# Patient Record
Sex: Male | Born: 1960 | Race: White | Hispanic: No | Marital: Married | Smoking: Never smoker
Health system: Southern US, Community
[De-identification: ages and names within clinical notes are randomized; demographics above are authoritative.]

## PROBLEM LIST (undated history)

## (undated) DIAGNOSIS — I10 Essential (primary) hypertension: Secondary | ICD-10-CM

## (undated) DIAGNOSIS — J342 Deviated nasal septum: Secondary | ICD-10-CM

## (undated) DIAGNOSIS — E785 Hyperlipidemia, unspecified: Secondary | ICD-10-CM

## (undated) DIAGNOSIS — M810 Age-related osteoporosis without current pathological fracture: Secondary | ICD-10-CM

## (undated) DIAGNOSIS — Z972 Presence of dental prosthetic device (complete) (partial): Secondary | ICD-10-CM

## (undated) DIAGNOSIS — J310 Chronic rhinitis: Secondary | ICD-10-CM

## (undated) HISTORY — PX: HERNIA REPAIR: SHX51

## (undated) HISTORY — PX: CLEFT PALATE REPAIR: SUR1165

## (undated) HISTORY — PX: COLONOSCOPY: SHX174

## (undated) HISTORY — PX: BACK SURGERY: SHX140

## (undated) HISTORY — PX: NASAL SINUS SURGERY: SHX719

## (undated) HISTORY — PX: SPINAL FUSION: SHX223

---

## 2010-03-08 ENCOUNTER — Ambulatory Visit: Payer: Self-pay | Admitting: Gastroenterology

## 2016-03-30 ENCOUNTER — Ambulatory Visit
Admission: RE | Admit: 2016-03-30 | Discharge: 2016-03-30 | Disposition: A | Discharge status: Home / Self Care | Payer: BLUE CROSS/BLUE SHIELD | Source: Ambulatory Visit | Attending: Otolaryngology | Admitting: Otolaryngology

## 2016-03-30 ENCOUNTER — Encounter
Admission: RE | Disposition: A | Discharge status: Home / Self Care | Payer: Self-pay | Source: Ambulatory Visit | Attending: Otolaryngology

## 2016-03-30 ENCOUNTER — Ambulatory Visit: Payer: BLUE CROSS/BLUE SHIELD | Admitting: Certified Registered"

## 2016-03-30 DIAGNOSIS — Z79899 Other long term (current) drug therapy: Secondary | ICD-10-CM | POA: Diagnosis not present

## 2016-03-30 DIAGNOSIS — J320 Chronic maxillary sinusitis: Secondary | ICD-10-CM | POA: Insufficient documentation

## 2016-03-30 DIAGNOSIS — J322 Chronic ethmoidal sinusitis: Secondary | ICD-10-CM | POA: Diagnosis not present

## 2016-03-30 DIAGNOSIS — Z880 Allergy status to penicillin: Secondary | ICD-10-CM | POA: Diagnosis not present

## 2016-03-30 DIAGNOSIS — J321 Chronic frontal sinusitis: Secondary | ICD-10-CM | POA: Insufficient documentation

## 2016-03-30 DIAGNOSIS — Z882 Allergy status to sulfonamides status: Secondary | ICD-10-CM | POA: Diagnosis not present

## 2016-03-30 DIAGNOSIS — I10 Essential (primary) hypertension: Secondary | ICD-10-CM | POA: Insufficient documentation

## 2016-03-30 DIAGNOSIS — Z981 Arthrodesis status: Secondary | ICD-10-CM | POA: Insufficient documentation

## 2016-03-30 HISTORY — DX: Deviated nasal septum: J34.2

## 2016-03-30 HISTORY — DX: Age-related osteoporosis without current pathological fracture: M81.0

## 2016-03-30 HISTORY — DX: Essential (primary) hypertension: I10

## 2016-03-30 HISTORY — DX: Presence of dental prosthetic device (complete) (partial): Z97.2

## 2016-03-30 HISTORY — PX: IMAGE GUIDED SINUS SURGERY: SHX6570

## 2016-03-30 HISTORY — DX: Chronic rhinitis: J31.0

## 2016-03-30 SURGERY — SINUS SURGERY, WITH IMAGING GUIDANCE
Anesthesia: General | Site: Nose | Wound class: Dirty or Infected

## 2016-03-30 MED ORDER — GLYCOPYRROLATE 0.2 MG/ML IJ SOLN
INTRAMUSCULAR | Status: DC | PRN
  Administered 2016-03-30: 0.1 mg via INTRAVENOUS

## 2016-03-30 MED ORDER — FENTANYL CITRATE (PF) 100 MCG/2ML IJ SOLN
INTRAMUSCULAR | Status: DC | PRN
  Administered 2016-03-30: 100 ug via INTRAVENOUS

## 2016-03-30 MED ORDER — LACTATED RINGERS IV SOLN
INTRAVENOUS | Status: DC
  Administered 2016-03-30: 07:00:00 via INTRAVENOUS

## 2016-03-30 MED ORDER — MIDAZOLAM HCL 5 MG/5ML IJ SOLN
INTRAMUSCULAR | Status: DC | PRN
  Administered 2016-03-30: 2 mg via INTRAVENOUS

## 2016-03-30 MED ORDER — LIDOCAINE-EPINEPHRINE 1 %-1:100000 IJ SOLN
INTRAMUSCULAR | Status: DC | PRN
  Administered 2016-03-30: 4 mL

## 2016-03-30 MED ORDER — ROCURONIUM BROMIDE 100 MG/10ML IV SOLN
INTRAVENOUS | Status: DC | PRN
  Administered 2016-03-30: 30 mg via INTRAVENOUS

## 2016-03-30 MED ORDER — SCOPOLAMINE 1 MG/3DAYS TD PT72
1.0000 | MEDICATED_PATCH | TRANSDERMAL | Status: DC
  Administered 2016-03-30: 1.5 mg via TRANSDERMAL

## 2016-03-30 MED ORDER — HYDROMORPHONE HCL 1 MG/ML IJ SOLN: 0.2500 mg | INTRAMUSCULAR | Status: DC | PRN

## 2016-03-30 MED ORDER — PROPOFOL 10 MG/ML IV BOLUS
INTRAVENOUS | Status: DC | PRN
  Administered 2016-03-30: 100 mg via INTRAVENOUS

## 2016-03-30 MED ORDER — OXYMETAZOLINE HCL 0.05 % NA SOLN
2.0000 | Freq: Once | NASAL | Status: AC
  Administered 2016-03-30: 2 via NASAL

## 2016-03-30 MED ORDER — LEVOFLOXACIN IN D5W 500 MG/100ML IV SOLN
500.0000 mg | Freq: Once | INTRAVENOUS | Status: AC
  Administered 2016-03-30: 500 mg via INTRAVENOUS

## 2016-03-30 MED ORDER — CEFAZOLIN SODIUM-DEXTROSE 2-4 GM/100ML-% IV SOLN: 2.0000 g | Freq: Once | INTRAVENOUS | Status: DC

## 2016-03-30 MED ORDER — OXYCODONE HCL 5 MG PO TABS
5.0000 mg | ORAL_TABLET | Freq: Once | ORAL | Status: AC | PRN
  Administered 2016-03-30: 5 mg via ORAL

## 2016-03-30 MED ORDER — MEPERIDINE HCL 25 MG/ML IJ SOLN: 6.2500 mg | INTRAMUSCULAR | Status: DC | PRN

## 2016-03-30 MED ORDER — DEXAMETHASONE SODIUM PHOSPHATE 4 MG/ML IJ SOLN
INTRAMUSCULAR | Status: DC | PRN
  Administered 2016-03-30: 10 mg via INTRAVENOUS

## 2016-03-30 MED ORDER — LIDOCAINE HCL (CARDIAC) 20 MG/ML IV SOLN
INTRAVENOUS | Status: DC | PRN
  Administered 2016-03-30: 40 mg via INTRAVENOUS

## 2016-03-30 MED ORDER — PROMETHAZINE HCL 25 MG/ML IJ SOLN: 6.2500 mg | INTRAMUSCULAR | Status: DC | PRN

## 2016-03-30 MED ORDER — ONDANSETRON HCL 4 MG/2ML IJ SOLN
INTRAMUSCULAR | Status: DC | PRN
  Administered 2016-03-30: 4 mg via INTRAVENOUS

## 2016-03-30 MED ORDER — ACETAMINOPHEN 10 MG/ML IV SOLN
1000.0000 mg | Freq: Once | INTRAVENOUS | Status: AC
  Administered 2016-03-30: 1000 mg via INTRAVENOUS

## 2016-03-30 MED ORDER — OXYCODONE HCL 5 MG/5ML PO SOLN: 5.0000 mg | Freq: Once | ORAL | Status: AC | PRN

## 2016-03-30 MED ORDER — PHENYLEPHRINE HCL 0.5 % NA SOLN
NASAL | Status: DC | PRN
  Administered 2016-03-30: 30 mL via TOPICAL

## 2016-03-30 SURGICAL SUPPLY — 29 items
BALLOON SINUPLASTY SYSTEM (BALLOONS) IMPLANT
BATTERY INSTRU NAVIGATION (MISCELLANEOUS) ×16 IMPLANT
CANISTER SUCT 1200ML W/VALVE (MISCELLANEOUS) ×4 IMPLANT
CATH IV 18X1 1/4 SAFELET (CATHETERS) ×4 IMPLANT
COAGULATOR SUCT 8FR VV (MISCELLANEOUS) IMPLANT
DEVICE INFLATION SEID (MISCELLANEOUS) IMPLANT
DRAPE HEAD BAR (DRAPES) ×4 IMPLANT
GLOVE PI ULTRA LF STRL 7.5 (GLOVE) ×4 IMPLANT
GLOVE PI ULTRA NON LATEX 7.5 (GLOVE) ×4
IRRIGATOR 4MM STR (IRRIGATION / IRRIGATOR) IMPLANT
IV CATH 18X1 1/4 SAFELET (CATHETERS) ×2
IV NS 500ML (IV SOLUTION) ×2
IV NS 500ML BAXH (IV SOLUTION) ×2 IMPLANT
KIT ROOM TURNOVER OR (KITS) ×4 IMPLANT
NAVIGATION MASK REG  ST (MISCELLANEOUS) ×4 IMPLANT
NEEDLE HYPO 25GX1X1/2 BEV (NEEDLE) ×4 IMPLANT
NEEDLE SPNL 25GX3.5 QUINCKE BL (NEEDLE) ×4 IMPLANT
NS IRRIG 500ML POUR BTL (IV SOLUTION) ×4 IMPLANT
PACK DRAPE NASAL/ENT (PACKS) ×4 IMPLANT
PACKING NASAL EPIS 4X2.4 XEROG (MISCELLANEOUS) ×8 IMPLANT
PAD GROUND ADULT SPLIT (MISCELLANEOUS) ×4 IMPLANT
PATTIES SURGICAL .5 X3 (DISPOSABLE) ×4 IMPLANT
SET HANDPIECE IRR DIEGO (MISCELLANEOUS) IMPLANT
SOL ANTI-FOG 6CC FOG-OUT (MISCELLANEOUS) ×2 IMPLANT
SOL FOG-OUT ANTI-FOG 6CC (MISCELLANEOUS) ×2
STRAP BODY AND KNEE 60X3 (MISCELLANEOUS) ×4 IMPLANT
SYR 3ML LL SCALE MARK (SYRINGE) ×4 IMPLANT
TOWEL OR 17X26 4PK STRL BLUE (TOWEL DISPOSABLE) ×4 IMPLANT
WATER STERILE IRR 500ML POUR (IV SOLUTION) ×4 IMPLANT

## 2016-03-30 NOTE — Anesthesia Postprocedure Evaluation (Signed)
Anesthesia Post Note  Patient: Jack Morrison  Procedure(s) Performed: Procedure(s) (LRB): IMAGE GUIDED SINUS SURGERY (N/A) MAXILLARY ANTROSTOMY WITH TISSUE REMOVAL (Bilateral) FRONTAL SINUS EXPLORATION (Bilateral) TOTAL ETHMOIDECTOMY (Bilateral)  Patient location during evaluation: PACU Anesthesia Type: General Level of consciousness: awake and alert and oriented Pain management: pain level controlled Vital Signs Assessment: post-procedure vital signs reviewed and stable Respiratory status: spontaneous breathing and nonlabored ventilation Cardiovascular status: stable Postop Assessment: no signs of nausea or vomiting and adequate PO intake Anesthetic complications: no    Harolyn RutherfordJoshua Aryelle Figg

## 2016-03-30 NOTE — Discharge Instructions (Signed)
[facility] °[facility] °ENDOSCOPIC SINUS SURGERY °[facility] ° °What is Functional Endoscopic Sinus Surgery? ° The Surgery involves making the natural openings of the sinuses larger by removing the bony partitions that separate the sinuses from the nasal cavity.  The natural sinus lining is preserved as much as possible to allow the sinuses to resume normal function after the surgery.  In some patients nasal polyps (excessively swollen lining of the sinuses) may be removed to relieve obstruction of the sinus openings.  The surgery is performed through the nose using lighted scopes, which eliminates the need for incisions on the face.  A septoplasty is a different procedure which is sometimes performed with sinus surgery.  It involves straightening the boy partition that separates the two sides of your nose.  A crooked or deviated septum may need repair if is obstructing the sinuses or nasal airflow.  Turbinate reduction is also often performed during sinus surgery.  The turbinates are bony proturberances from the side walls of the nose which swell and can obstruct the nose in patients with sinus and allergy problems.  Their size can be surgically reduced to help relieve nasal obstruction. ° °What Can Sinus Surgery Do For Me? ° Sinus surgery can reduce the frequency of sinus infections requiring antibiotic treatment.  This can provide improvement in nasal congestion, post-nasal drainage, facial pressure and nasal obstruction.  Surgery will NOT prevent you from ever having an infection again, so it usually only for patients who get infections 4 or more times yearly requiring antibiotics, or for infections that do not clear with antibiotics.  It will not cure nasal allergies, so patients with allergies may still require medication to treat their allergies after surgery. Surgery may improve headaches related to sinusitis, however, some people will continue to  require medication to control sinus headaches related to allergies.  Surgery will do nothing for other forms of headache (migraine, tension or cluster). ° °What Are the Risks of Endoscopic Sinus Surgery? ° Current techniques allow surgery to be performed safely with little risk, however, there are rare complications that patients should be aware of.  Because the sinuses are located around the eyes, there is risk of eye injury, including blindness, though again, this would be quite rare. This is usually a result of bleeding behind the eye during surgery, which puts the vision oat risk, though there are treatments to protect the vision and prevent permanent disrupted by surgery causing a leak of the spinal fluid that surrounds the brain.  More serious complications would include bleeding inside the brain cavity or damage to the brain.  Again, all of these complications are uncommon, and spinal fluid leaks can be safely managed surgically if they occur.  The most common complication of sinus surgery is bleeding from the nose, which may require packing or cauterization of the nose.  Continued sinus have polyps may experience recurrence of the polyps requiring revision surgery.  Alterations of sense of smell or injury to the tear ducts are also rare complications.  ° °What is the Surgery Like, and what is the Recovery? ° The Surgery usually takes a couple of hours to perform, and is usually performed under a general anesthetic (completely asleep).  Patients are usually discharged home after a couple of hours.  Sometimes during surgery it is necessary to pack the nose to control bleeding, and the packing is left in place for 24 - 48 hours, and removed by your surgeon.    If a septoplasty was performed during the procedure, there is often a splint placed which must be removed after 5-7 days.   °Discomfort: Pain is usually mild to moderate, and can be controlled by prescription pain medication or acetaminophen (Tylenol).   Aspirin, Ibuprofen (Advil, Motrin), or Naprosyn (Aleve) should be avoided, as they can cause increased bleeding.  Most patients feel sinus pressure like they have a bad head cold for several days.  Sleeping with your head elevated can help reduce swelling and facial pressure, as can ice packs over the face.  A humidifier may be helpful to keep the mucous and blood from drying in the nose.  ° °Diet: There are no specific diet restrictions, however, you should generally start with clear liquids and a light diet of bland foods because the anesthetic can cause some nausea.  Advance your diet depending on how your stomach feels.  Taking your pain medication with food will often help reduce stomach upset which pain medications can cause. ° °Nasal Saline Irrigation: It is important to remove blood clots and dried mucous from the nose as it is healing.  This is done by having you irrigate the nose at least 3 - 4 times daily with a salt water solution.  We recommend using NeilMed Sinus Rinse (available at the drug store).  Fill the squeeze bottle with the solution, bend over a sink, and insert the tip of the squeeze bottle into the nose ½ of an inch.  Point the tip of the squeeze bottle towards the inside corner of the eye on the same side your irrigating.  Squeeze the bottle and gently irrigate the nose.  If you bend forward as you do this, most of the fluid will flow back out of the nose, instead of down your throat.   The solution should be warm, near body temperature, when you irrigate.   Each time you irrigate, you should use a full squeeze bottle.  ° °Note that if you are instructed to use Nasal Steroid Sprays at any time after your surgery, irrigate with saline BEFORE using the steroid spray, so you do not wash it all out of the nose. °Another product, Nasal Saline Gel (such as AYR Nasal Saline Gel) can be applied in each nostril 3 - 4 times daily to moisture the nose and reduce scabbing or crusting. ° °Bleeding:   Bloody drainage from the nose can be expected for several days, and patients are instructed to irrigate their nose frequently with salt water to help remove mucous and blood clots.  The drainage may be dark red or brown, though some fresh blood may be seen intermittently, especially after irrigation.  Do not blow you nose, as bleeding may occur. If you must sneeze, keep your mouth open to allow air to escape through your mouth. ° °If heavy bleeding occurs: Irrigate the nose with saline to rinse out clots, then spray the nose 3 - 4 times with Afrin Nasal Decongestant Spray.  The spray will constrict the blood vessels to slow bleeding.  Pinch the lower half of your nose shut to apply pressure, and lay down with your head elevated.  Ice packs over the nose may help as well. If bleeding persists despite these measures, you should notify your doctor.  Do not use the Afrin routinely to control nasal congestion after surgery, as it can result in worsening congestion and may affect healing.  ° ° ° °Activity: Return to work varies among patients. Most patients will be   out of work at least 5 - 7 days to recover.  Patient may return to work after they are off of narcotic pain medication, and feeling well enough to perform the functions of their job.  Patients must avoid heavy lifting (over 10 pounds) or strenuous physical for 2 weeks after surgery, so your employer may need to assign you to light duty, or keep you out of work longer if light duty is not possible.  NOTE: you should not drive, operate dangerous machinery, do any mentally demanding tasks or make any important legal or financial decisions while on narcotic pain medication and recovering from the general anesthetic.    Call Your Doctor Immediately if You Have Any of the Following: 1. Bleeding that you cannot control with the above measures 2. Loss of vision, double vision, bulging of the eye or black eyes. 3. Fever over 101 degrees 4. Neck stiffness with  severe headache, fever, nausea and change in mental state. You are always encourage to call anytime with concerns, however, please call with requests for pain medication refills during office hours.  Office Endoscopy: During follow-up visits your doctor will remove any packing or splints that may have been placed and evaluate and clean your sinuses endoscopically.  Topical anesthetic will be used to make this as comfortable as possible, though you may want to take your pain medication prior to the visit.  How often this will need to be done varies from patient to patient.  After complete recovery from the surgery, you may need follow-up endoscopy from time to time, particularly if there is concern of recurrent infection or nasal polyps.    General Anesthesia, Adult, Care After Refer to this sheet in the next few weeks. These instructions provide you with information on caring for yourself after your procedure. Your health care provider may also give you more specific instructions. Your treatment has been planned according to current medical practices, but problems sometimes occur. Call your health care provider if you have any problems or questions after your procedure. WHAT TO EXPECT AFTER THE PROCEDURE After the procedure, it is typical to experience:  Sleepiness.  Nausea and vomiting. HOME CARE INSTRUCTIONS  For the first 24 hours after general anesthesia:  Have a responsible person with you.  Do not drive a car. If you are alone, do not take public transportation.  Do not drink alcohol.  Do not take medicine that has not been prescribed by your health care provider.  Do not sign important papers or make important decisions.  You may resume a normal diet and activities as directed by your health care provider.  Change bandages (dressings) as directed.  If you have questions or problems that seem related to general anesthesia, call the hospital and ask for the anesthetist or  anesthesiologist on call. SEEK MEDICAL CARE IF:  You have nausea and vomiting that continue the day after anesthesia.  You develop a rash. SEEK IMMEDIATE MEDICAL CARE IF:   You have difficulty breathing.  You have chest pain.  You have any allergic problems.   This information is not intended to replace advice given to you by your health care provider. Make sure you discuss any questions you have with your health care provider.   Document Released: 01/08/2001 Document Revised: 10/23/2014 Document Reviewed: 01/31/2012 Elsevier Interactive Patient Education 2016 [employer].  Scopolamine skin patches REMOVE PATCH IN 72 HOURS What is this medicine? SCOPOLAMINE (skoe POL a meen) is used to prevent nausea and vomiting caused  by motion sickness, anesthesia and surgery. This medicine may be used for other purposes; ask your health care provider or pharmacist if you have questions. What should I tell my health care provider before I take this medicine? They need to know if you have any of these conditions: -glaucoma -kidney or liver disease -an unusual or allergic reaction (especially skin allergy) to scopolamine, atropine, other medicines, foods, dyes, or preservatives -pregnant or trying to get pregnant -breast-feeding How should I use this medicine? This medicine is for external use only. Follow the directions on the prescription label. One patch contains enough medicine to prevent motion sickness for up to 3 days. Apply the patch at least 4 hours before you need it and only wear one disc at a time. Choose an area behind the ear, that is clean, dry, hairless and free from any cuts or irritation. Wipe the area with a clean dry tissue. Peel off the plastic backing of the skin patch, trying not to touch the adhesive side with your hands. Do not cut the patches. Firmly apply to the area you have chosen, with the metallic side of the patch to the skin and the tan-colored side showing. Once  firmly in place, wash your hands well with soap and water. Remove the disc after 3 days, or sooner if you no longer need it. After removing the patch, wash your hands and the area behind your ear thoroughly with soap and water. The patch will still contain some medicine after use. To avoid accidental contact or ingestion by children or pets, fold the used patch in half with the sticky side together and throw away in the trash out of the reach of children and pets. If you need to use a second patch after you remove the first, place it behind the other ear. Talk to your pediatrician regarding the use of this medicine in children. Special care may be needed. Overdosage: If you think you have taken too much of this medicine contact a poison control center or emergency room at once. NOTE: This medicine is only for you. Do not share this medicine with others. What if I miss a dose? Make sure you apply the patch at least 4 hours before you need it. You can apply it the night before traveling. What may interact with this medicine? -benztropine -bethanechol -medicines for anxiety or sleeping problems like diazepam or temazepam -medicines for hay fever and other allergies -medicines for mental depression -muscle relaxants This list may not describe all possible interactions. Give your health care provider a list of all the medicines, herbs, non-prescription drugs, or dietary supplements you use. Also tell them if you smoke, drink alcohol, or use illegal drugs. Some items may interact with your medicine. What should I watch for while using this medicine? Keep the patch dry, if possible, to prevent it from falling off. Limited contact with water, however, as in bathing or swimming, will not affect the system. If the patch falls off, throw it away and put a new one behind the other ear. You may get drowsy or dizzy. Do not drive, use machinery, or do anything that needs mental alertness until you know how this  medicine affects you. Do not stand or sit up quickly, especially if you are an older patient. This reduces the risk of dizzy or fainting spells. Alcohol may interfere with the effect of this medicine. Avoid alcoholic drinks. Your mouth may get dry. Chewing sugarless gum or sucking hard candy, and drinking plenty  of water may help. Contact your doctor if the problem does not go away or is severe. This medicine may cause dry eyes and blurred vision. If you wear contact lenses you may feel some discomfort. Lubricating drops may help. See your eye doctor if the problem does not go away or is severe. If you are going to have a magnetic resonance imaging (MRI) procedure, tell your MRI technician if you have this patch on your body. It must be removed before a MRI. What side effects may I notice from receiving this medicine? Side effects that you should report to your doctor or health care professional as soon as possible: -agitation, nervousness, confusion -blurred vision and other eye problems -dizziness, drowsiness -eye pain or redness in the whites of the eye -hallucinations -pain or difficulty passing urine -skin rash, itching -vomiting Side effects that usually do not require medical attention (report to your doctor or health care professional if they continue or are bothersome): -headache -nausea This list may not describe all possible side effects. Call your doctor for medical advice about side effects. You may report side effects to FDA at 1-800-FDA-1088. Where should I keep my medicine? Keep out of the reach of children. Store at room temperature between 20 and 25 degrees C (68 and 77 degrees F). Throw away any unused medicine after the expiration date. When you remove a patch, fold it and throw it in the trash as described above. NOTE: This sheet is a summary. It may not cover all possible information. If you have questions about this medicine, talk to your doctor, pharmacist, or health care  provider.    2016, Elsevier/Gold Standard. (2012-02-29 13:31:48)

## 2016-03-30 NOTE — Anesthesia Preprocedure Evaluation (Addendum)
Anesthesia Evaluation  Patient identified by MRN, date of birth, ID band Patient awake    Reviewed: Allergy & Precautions, NPO status , Patient's Chart, lab work & pertinent test results  Airway Mallampati: I  TM Distance: >3 FB Neck ROM: Full    Dental  (+) Partial Upper   Pulmonary neg pulmonary ROS,    Pulmonary exam normal        Cardiovascular hypertension, Pt. on medications Normal cardiovascular exam     Neuro/Psych negative neurological ROS     GI/Hepatic negative GI ROS, Neg liver ROS,   Endo/Other  negative endocrine ROS  Renal/GU negative Renal ROS  negative genitourinary   Musculoskeletal negative musculoskeletal ROS (+)   Abdominal   Peds  Hematology negative hematology ROS (+)   Anesthesia Other Findings   Reproductive/Obstetrics                            Anesthesia Physical Anesthesia Plan  ASA: II  Anesthesia Plan: General   Post-op Pain Management:    Induction: Intravenous  Airway Management Planned:   Additional Equipment:   Intra-op Plan:   Post-operative Plan:   Informed Consent: I have reviewed the patients History and Physical, chart, labs and discussed the procedure including the risks, benefits and alternatives for the proposed anesthesia with the patient or authorized representative who has indicated his/her understanding and acceptance.     Plan Discussed with: CRNA  Anesthesia Plan Comments:         Anesthesia Quick Evaluation

## 2016-03-30 NOTE — H&P (Signed)
  H&P has been reviewed and no changes necessary. To be downloaded later. 

## 2016-03-30 NOTE — Transfer of Care (Signed)
Immediate Anesthesia Transfer of Care Note  Patient: Jack Morrison  Procedure(s) Performed: Procedure(s) with comments: IMAGE GUIDED SINUS SURGERY (N/A) - Dee gave disk to brenda MAXILLARY ANTROSTOMY WITH TISSUE REMOVAL (Bilateral) FRONTAL SINUS EXPLORATION (Bilateral) TOTAL ETHMOIDECTOMY (Bilateral)  Patient Location: PACU  Anesthesia Type: General  Level of Consciousness: awake, alert  and patient cooperative  Airway and Oxygen Therapy: Patient Spontanous Breathing and Patient connected to supplemental oxygen  Post-op Assessment: Post-op Vital signs reviewed, Patient's Cardiovascular Status Stable, Respiratory Function Stable, Patent Airway and No signs of Nausea or vomiting  Post-op Vital Signs: Reviewed and stable  Complications: No apparent anesthesia complications

## 2016-03-30 NOTE — Anesthesia Procedure Notes (Signed)
Procedure Name: Intubation Date/Time: 03/30/2016 7:42 AM Performed by: Jimmy PicketAMYOT, Jefferey Lippmann Pre-anesthesia Checklist: Patient identified, Emergency Drugs available, Suction available, Patient being monitored and Timeout performed Patient Re-evaluated:Patient Re-evaluated prior to inductionOxygen Delivery Method: Circle system utilized Preoxygenation: Pre-oxygenation with 100% oxygen Intubation Type: IV induction Ventilation: Mask ventilation without difficulty Laryngoscope Size: Miller and 3 Grade View: Grade I Tube type: Oral Rae Tube size: 7.5 mm Number of attempts: 1 Placement Confirmation: ETT inserted through vocal cords under direct vision,  positive ETCO2 and breath sounds checked- equal and bilateral Tube secured with: Tape Dental Injury: Teeth and Oropharynx as per pre-operative assessment

## 2016-03-30 NOTE — Op Note (Signed)
03/30/2016  9:41 AM    Jack Morrison  [id number]   Pre-Op Dx:  Previous cleft palate and repair, chronic bilateral maxillary sinusitis, chronic bilateral ethmoid sinusitis, chronic bilateral frontal sinusitis  Post-op Dx: Same  Proc: Bilateral endoscopic maxillary antrostomies with removal of contents, bilateral endoscopic total ethmoidectomies, bilateral endoscopic frontal sinusotomies, use of image guided system   Surg:  Joziah Dollins H  Anes:  GOT  EBL:  50 mL  Comp:  None  Findings:  Patient had whitish purulent mucus in both maxillary sinuses. There was thickened mucous membranes throughout the ethmoid neck the opening to the frontal sinuses and in the maxillary sinuses. There is slight deviation of the septum anteriorly and the posterior septum missing from the cleft palate.  Procedure: The patient was given general anesthesia by oral endotracheal intubation. The nose was prepped with cottonoid pledgets soaked in phenylephrine and Xylocaine. These were placed in the nose both sides without sit for 10 minutes while he was prepared. The image guided system was brought in and the CT scan was downloaded from the disc. The template was applied the face and registered to the system as well. The suction instruments were registered the system. There was 0.8 mm of variance but the suction instruments lined up properly with the system. He was prepped and draped in sterile fashion.  Cotton pledges were removed and the 0 scope was used to visualize the airway on both sides. 4 mL 1% Xylocaine with epi 1 200,000 were used for infiltration of the uncinate process and the anterior middle turbinate. 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 The microdebrider was used for removing the lateral wall of the conchal bullosa open up this air cell completely. The uncinate process was then incised using side biter and the  maxillary antrum was opened widely. Inside the maxillary sinus was a thick whitish mucus. A culture was taken from this and then the sinus was completely cleaned out of mucus. Mucous membranes were inflamed and thickened. The natural ostium was opened completely using the 30 and 70 scopes to make sure that this was cleared. The 0 scope was then used again with the image guided system for opening of the middle and posterior ethmoid air cells. The image guided system was used to make sure all the air cells were opened completely. The 30 scope was used to visualize the anterior ethmoid air cells and cleaning of these. The frontal sinus duct was found lying medial posterior to a type I frontal cell. The back wall of frontal cell was removed to create a large opening into the frontal sinus duct. This was done using the 30 scope and frontal sinus instruments that were through biting keep it all mucosal lined. The 0 and 30 scopes were then used to revisualize all sinuses make sure they were opened. The image guided system was used to verify that all the sinuses were opened. A cottonoid pledget was placed or temporarily while the right side was visualized.  The 0 scope was used to visualize the right nasal cavity. His nostril was a little smaller on this side from the cleft repair in the airway was somewhat narrower. Her turbinates previously been trimmed and tried to not remove any tissue from the inferior or middle turbinates. The middle turbinate was infractured show the middle meatus or. 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 The side biter was used to incise the uncinate process and this was removed with microdebrider and the maxillary antrum was opened. The 30 scope was used to visualize this. There is thick whitish mucus in the maxillary sinus on the right side as well. Once the maxillary antrum was wide open including the natural  ostium then this was all cleaned out of all the mucus make sure the sinus was clear. There was thickened mucous membranes in the sinus. The 0 scope was used for visualizing the posterior middle ethmoid air cells and these were opened. The image guided system was used to make sure the depth of dissection all the sinuses were opened. The 30 scope was then used to help open up the anterior ethmoid air cells. There was a medial frontal sinus that was opened widely and part of the party wall was removed make sure there is still a good opening into the frontal sinus duct. This was done using frontal sinus instruments and the 30 and 70 scopes. The image guided system was used to verify the frontal sinus duct was wide open. Cottonoid pledget was then placed in the right side for vasoconstriction is all the sinuses were now open.  The left side was revisualized cottonoid pledgets removed. A couple small bone shards were removed make sure that the area was clean. Xerogel was placed into the frontal sinus duct and filling the ethmoid sinus. His was widened to create the gel. The right side was then visualized again with the image guided system and make sure is clean and there were no bone shards anywhere. We'll sinus duct was then filled with xerogel again and also the rest the ethmoid sinus.  The patient tolerated the procedure well and was awakened and taken to the recovery room in satisfactory condition.   there were no operative complications.  Dispo:   To PACU to be discharged home  Plan:  Follow-up in the office in 6 days. Rest at home with head elevated. Use steroid taper and antibiotics as prescribed. Start saline flushes tomorrow. Call if problems  Tahjai Schetter H  03/30/2016 9:41 AM

## 2016-03-31 ENCOUNTER — Encounter: Payer: Self-pay | Admitting: Otolaryngology

## 2016-04-03 LAB — SURGICAL PATHOLOGY

## 2016-04-04 LAB — AEROBIC/ANAEROBIC CULTURE (SURGICAL/DEEP WOUND)

## 2016-04-04 LAB — AEROBIC/ANAEROBIC CULTURE W GRAM STAIN (SURGICAL/DEEP WOUND)

## 2016-04-27 ENCOUNTER — Other Ambulatory Visit: Payer: Self-pay | Admitting: Otolaryngology

## 2016-04-27 DIAGNOSIS — H90A22 Sensorineural hearing loss, unilateral, left ear, with restricted hearing on the contralateral side: Secondary | ICD-10-CM

## 2016-05-10 ENCOUNTER — Ambulatory Visit: Payer: BLUE CROSS/BLUE SHIELD

## 2016-05-10 ENCOUNTER — Other Ambulatory Visit: Payer: Self-pay | Admitting: Family Medicine

## 2016-05-10 DIAGNOSIS — R1032 Left lower quadrant pain: Secondary | ICD-10-CM

## 2016-05-12 ENCOUNTER — Ambulatory Visit
Admission: RE | Admit: 2016-05-12 | Discharge: 2016-05-12 | Disposition: A | Discharge status: Home / Self Care | Payer: BLUE CROSS/BLUE SHIELD | Source: Ambulatory Visit | Attending: Family Medicine | Admitting: Family Medicine

## 2016-05-12 DIAGNOSIS — K4090 Unilateral inguinal hernia, without obstruction or gangrene, not specified as recurrent: Secondary | ICD-10-CM | POA: Diagnosis not present

## 2016-05-12 DIAGNOSIS — R1032 Left lower quadrant pain: Secondary | ICD-10-CM | POA: Diagnosis present

## 2016-05-17 ENCOUNTER — Ambulatory Visit
Admission: RE | Admit: 2016-05-17 | Discharge: 2016-05-17 | Disposition: A | Discharge status: Home / Self Care | Payer: BLUE CROSS/BLUE SHIELD | Source: Ambulatory Visit | Attending: Otolaryngology | Admitting: Otolaryngology

## 2016-05-17 DIAGNOSIS — H90A22 Sensorineural hearing loss, unilateral, left ear, with restricted hearing on the contralateral side: Secondary | ICD-10-CM | POA: Insufficient documentation

## 2016-05-17 DIAGNOSIS — J320 Chronic maxillary sinusitis: Secondary | ICD-10-CM | POA: Insufficient documentation

## 2016-05-17 MED ORDER — GADOBENATE DIMEGLUMINE 529 MG/ML IV SOLN
20.0000 mL | Freq: Once | INTRAVENOUS | Status: AC | PRN
  Administered 2016-05-17: 17 mL via INTRAVENOUS

## 2016-06-16 ENCOUNTER — Encounter
Admission: RE | Admit: 2016-06-16 | Discharge: 2016-06-16 | Disposition: A | Discharge status: Home / Self Care | Payer: BLUE CROSS/BLUE SHIELD | Source: Ambulatory Visit | Attending: Surgery | Admitting: Surgery

## 2016-06-16 NOTE — Patient Instructions (Signed)
  Your procedure is scheduled on: 06-23-16 (FRIDAY) Report to Same Day Surgery 2nd floor medical mall To find out your arrival time please call [phone] between 1PM - 3PM on 06-22-16 (THURSDAY)  Remember: Instructions that are not followed completely may result in serious medical risk, up to and including death, or upon the discretion of your surgeon and anesthesiologist your surgery may need to be rescheduled.    _x___ 1. Do not eat food or drink liquids after midnight. No gum chewing or hard candies.     __x__ 2. No Alcohol for 24 hours before or after surgery.   __x__3. No Smoking for 24 prior to surgery.   ____  4. Bring all medications with you on the day of surgery if instructed.    __x__ 5. Notify your doctor if there is any change in your medical condition     (cold, fever, infections).     Do not wear jewelry, make-up, hairpins, clips or nail polish.  Do not wear lotions, powders, or perfumes. You may wear deodorant.  Do not shave 48 hours prior to surgery. Men may shave face and neck.  Do not bring valuables to the hospital.    [facility] is not responsible for any belongings or valuables.               Contacts, dentures or bridgework may not be worn into surgery.  Leave your suitcase in the car. After surgery it may be brought to your room.  For patients admitted to the hospital, discharge time is determined by your treatment team.   Patients discharged the day of surgery will not be allowed to drive home.    Please read over the following fact sheets that you were given:   [facility] Preparing for Surgery and or MRSA Information   ____ Take these medicines the morning of surgery with A SIP OF WATER:    1.NONE   2.  3.  4.  5.  6.  ____ Fleet Enema (as directed)   _x___ Use CHG Soap or sage wipes as directed on instruction sheet   ____ Use inhalers on the day of surgery and bring to hospital day of surgery  ____ Stop metformin 2 days prior to  surgery    ____ Take 1/2 of usual insulin dose the night before surgery and none on the morning of  surgery.   ____ Stop aspirin or coumadin, or plavix  _x__ Stop Anti-inflammatories such as Advil, Aleve, Ibuprofen, Motrin, Naproxen,          Naprosyn, Goodies powders or aspirin products NOW. Ok to take Tylenol.   ____ Stop supplements until after surgery.    ____ Bring C-Pap to the hospital.

## 2016-06-20 ENCOUNTER — Encounter
Admission: RE | Admit: 2016-06-20 | Discharge: 2016-06-20 | Disposition: A | Discharge status: Home / Self Care | Payer: BLUE CROSS/BLUE SHIELD | Source: Ambulatory Visit | Attending: Surgery | Admitting: Surgery

## 2016-06-20 ENCOUNTER — Encounter: Payer: Self-pay | Admitting: *Deleted

## 2016-06-20 DIAGNOSIS — G8929 Other chronic pain: Secondary | ICD-10-CM | POA: Diagnosis not present

## 2016-06-20 DIAGNOSIS — Z880 Allergy status to penicillin: Secondary | ICD-10-CM | POA: Diagnosis not present

## 2016-06-20 DIAGNOSIS — Z79899 Other long term (current) drug therapy: Secondary | ICD-10-CM | POA: Diagnosis not present

## 2016-06-20 DIAGNOSIS — E559 Vitamin D deficiency, unspecified: Secondary | ICD-10-CM | POA: Diagnosis not present

## 2016-06-20 DIAGNOSIS — K4020 Bilateral inguinal hernia, without obstruction or gangrene, not specified as recurrent: Secondary | ICD-10-CM | POA: Diagnosis present

## 2016-06-20 DIAGNOSIS — M549 Dorsalgia, unspecified: Secondary | ICD-10-CM | POA: Diagnosis not present

## 2016-06-20 DIAGNOSIS — Z888 Allergy status to other drugs, medicaments and biological substances status: Secondary | ICD-10-CM | POA: Diagnosis not present

## 2016-06-20 DIAGNOSIS — Z881 Allergy status to other antibiotic agents status: Secondary | ICD-10-CM | POA: Diagnosis not present

## 2016-06-20 DIAGNOSIS — Z882 Allergy status to sulfonamides status: Secondary | ICD-10-CM | POA: Diagnosis not present

## 2016-06-20 DIAGNOSIS — J309 Allergic rhinitis, unspecified: Secondary | ICD-10-CM | POA: Diagnosis not present

## 2016-06-20 DIAGNOSIS — M810 Age-related osteoporosis without current pathological fracture: Secondary | ICD-10-CM | POA: Diagnosis not present

## 2016-06-20 DIAGNOSIS — E785 Hyperlipidemia, unspecified: Secondary | ICD-10-CM | POA: Diagnosis not present

## 2016-06-20 LAB — POTASSIUM: Potassium: 3.5 mmol/L (ref 3.5–5.1)

## 2016-06-22 NOTE — Pre-Procedure Instructions (Signed)
SPOKE WITH DR ADAMS REGADING ABNORMAL EKG-EXPLAINED THAT PT [facility]  AND THEY DID NOT DO AN EKG- NOTHING HAS CHANGED IN PTS SYMPTOMS SINCE June- OK TO PROCEED PER DR ADAMS

## 2016-06-23 ENCOUNTER — Encounter
Admission: RE | Disposition: A | Discharge status: Home / Self Care | Payer: Self-pay | Source: Ambulatory Visit | Attending: Surgery

## 2016-06-23 ENCOUNTER — Ambulatory Visit
Admission: RE | Admit: 2016-06-23 | Discharge: 2016-06-23 | Disposition: A | Discharge status: Home / Self Care | Payer: BLUE CROSS/BLUE SHIELD | Source: Ambulatory Visit | Attending: Surgery | Admitting: Surgery

## 2016-06-23 ENCOUNTER — Ambulatory Visit: Payer: BLUE CROSS/BLUE SHIELD | Admitting: Anesthesiology

## 2016-06-23 DIAGNOSIS — E559 Vitamin D deficiency, unspecified: Secondary | ICD-10-CM | POA: Insufficient documentation

## 2016-06-23 DIAGNOSIS — Z880 Allergy status to penicillin: Secondary | ICD-10-CM | POA: Insufficient documentation

## 2016-06-23 DIAGNOSIS — K4020 Bilateral inguinal hernia, without obstruction or gangrene, not specified as recurrent: Secondary | ICD-10-CM | POA: Insufficient documentation

## 2016-06-23 DIAGNOSIS — M549 Dorsalgia, unspecified: Secondary | ICD-10-CM | POA: Insufficient documentation

## 2016-06-23 DIAGNOSIS — E785 Hyperlipidemia, unspecified: Secondary | ICD-10-CM | POA: Insufficient documentation

## 2016-06-23 DIAGNOSIS — Z882 Allergy status to sulfonamides status: Secondary | ICD-10-CM | POA: Insufficient documentation

## 2016-06-23 DIAGNOSIS — Z881 Allergy status to other antibiotic agents status: Secondary | ICD-10-CM | POA: Insufficient documentation

## 2016-06-23 DIAGNOSIS — Z888 Allergy status to other drugs, medicaments and biological substances status: Secondary | ICD-10-CM | POA: Insufficient documentation

## 2016-06-23 DIAGNOSIS — G8929 Other chronic pain: Secondary | ICD-10-CM | POA: Insufficient documentation

## 2016-06-23 DIAGNOSIS — J309 Allergic rhinitis, unspecified: Secondary | ICD-10-CM | POA: Insufficient documentation

## 2016-06-23 DIAGNOSIS — Z79899 Other long term (current) drug therapy: Secondary | ICD-10-CM | POA: Insufficient documentation

## 2016-06-23 DIAGNOSIS — M810 Age-related osteoporosis without current pathological fracture: Secondary | ICD-10-CM | POA: Insufficient documentation

## 2016-06-23 HISTORY — PX: INGUINAL HERNIA REPAIR: SHX194

## 2016-06-23 SURGERY — REPAIR, HERNIA, INGUINAL, ADULT
Anesthesia: General | Site: Inguinal | Laterality: Bilateral | Wound class: Clean

## 2016-06-23 MED ORDER — BUPIVACAINE-EPINEPHRINE (PF) 0.5% -1:200000 IJ SOLN
INTRAMUSCULAR | Status: DC | PRN
  Administered 2016-06-23: 28 mL via PERINEURAL

## 2016-06-23 MED ORDER — DEXAMETHASONE SODIUM PHOSPHATE 10 MG/ML IJ SOLN
INTRAMUSCULAR | Status: DC | PRN
  Administered 2016-06-23: 10 mg via INTRAVENOUS

## 2016-06-23 MED ORDER — FENTANYL CITRATE (PF) 100 MCG/2ML IJ SOLN
INTRAMUSCULAR | Status: AC
  Filled 2016-06-23: qty 2

## 2016-06-23 MED ORDER — MIDAZOLAM HCL 2 MG/2ML IJ SOLN
INTRAMUSCULAR | Status: DC | PRN
  Administered 2016-06-23: 2 mg via INTRAVENOUS

## 2016-06-23 MED ORDER — FAMOTIDINE 20 MG PO TABS
ORAL_TABLET | ORAL | Status: AC
  Administered 2016-06-23: 20 mg via ORAL
  Filled 2016-06-23: qty 1

## 2016-06-23 MED ORDER — ROCURONIUM BROMIDE 100 MG/10ML IV SOLN
INTRAVENOUS | Status: DC | PRN
  Administered 2016-06-23: 10 mg via INTRAVENOUS
  Administered 2016-06-23: 5 mg via INTRAVENOUS
  Administered 2016-06-23: 25 mg via INTRAVENOUS
  Administered 2016-06-23: 10 mg via INTRAVENOUS

## 2016-06-23 MED ORDER — KETOROLAC TROMETHAMINE 30 MG/ML IJ SOLN
INTRAMUSCULAR | Status: DC | PRN
  Administered 2016-06-23: 30 mg via INTRAVENOUS

## 2016-06-23 MED ORDER — ONDANSETRON HCL 4 MG/2ML IJ SOLN
INTRAMUSCULAR | Status: DC | PRN
  Administered 2016-06-23: 4 mg via INTRAVENOUS

## 2016-06-23 MED ORDER — FENTANYL CITRATE (PF) 100 MCG/2ML IJ SOLN
25.0000 ug | INTRAMUSCULAR | Status: DC | PRN
  Administered 2016-06-23 (×2): 25 ug via INTRAVENOUS

## 2016-06-23 MED ORDER — SUGAMMADEX SODIUM 200 MG/2ML IV SOLN
INTRAVENOUS | Status: DC | PRN
  Administered 2016-06-23: 163.2 mg via INTRAVENOUS

## 2016-06-23 MED ORDER — FAMOTIDINE 20 MG PO TABS
20.0000 mg | ORAL_TABLET | Freq: Once | ORAL | Status: AC
  Administered 2016-06-23: 20 mg via ORAL

## 2016-06-23 MED ORDER — LACTATED RINGERS IV SOLN
INTRAVENOUS | Status: DC
  Administered 2016-06-23 (×2): via INTRAVENOUS

## 2016-06-23 MED ORDER — OXYCODONE HCL 5 MG PO TABS: 5.0000 mg | ORAL_TABLET | Freq: Once | ORAL | Status: DC | PRN

## 2016-06-23 MED ORDER — OXYCODONE HCL 5 MG/5ML PO SOLN: 5.0000 mg | Freq: Once | ORAL | Status: DC | PRN

## 2016-06-23 MED ORDER — BUPIVACAINE-EPINEPHRINE (PF) 0.5% -1:200000 IJ SOLN
INTRAMUSCULAR | Status: AC
  Filled 2016-06-23: qty 30

## 2016-06-23 MED ORDER — VANCOMYCIN HCL IN DEXTROSE 1-5 GM/200ML-% IV SOLN
INTRAVENOUS | Status: AC
  Administered 2016-06-23: 1000 mg via INTRAVENOUS
  Filled 2016-06-23: qty 200

## 2016-06-23 MED ORDER — PROPOFOL 10 MG/ML IV BOLUS
INTRAVENOUS | Status: DC | PRN
  Administered 2016-06-23: 160 mg via INTRAVENOUS

## 2016-06-23 MED ORDER — HYDROCODONE-ACETAMINOPHEN 5-325 MG PO TABS: 1.0000 | ORAL_TABLET | ORAL | Status: DC | PRN

## 2016-06-23 MED ORDER — EPHEDRINE SULFATE 50 MG/ML IJ SOLN
INTRAMUSCULAR | Status: DC | PRN
  Administered 2016-06-23 (×2): 10 mg via INTRAVENOUS

## 2016-06-23 MED ORDER — VANCOMYCIN HCL IN DEXTROSE 1-5 GM/200ML-% IV SOLN
1000.0000 mg | Freq: Once | INTRAVENOUS | Status: AC
  Administered 2016-06-23: 1000 mg via INTRAVENOUS

## 2016-06-23 MED ORDER — SUCCINYLCHOLINE CHLORIDE 20 MG/ML IJ SOLN
INTRAMUSCULAR | Status: DC | PRN
  Administered 2016-06-23: 160 mg via INTRAVENOUS

## 2016-06-23 MED ORDER — FENTANYL CITRATE (PF) 100 MCG/2ML IJ SOLN
INTRAMUSCULAR | Status: DC | PRN
  Administered 2016-06-23: 50 ug via INTRAVENOUS
  Administered 2016-06-23: 25 ug via INTRAVENOUS
  Administered 2016-06-23 (×5): 50 ug via INTRAVENOUS

## 2016-06-23 MED ORDER — HYDROCODONE-ACETAMINOPHEN 5-325 MG PO TABS: 1.0000 | ORAL_TABLET | ORAL | 0 refills | Status: AC | PRN

## 2016-06-23 SURGICAL SUPPLY — 26 items
BLADE SURG 15 STRL LF DISP TIS (BLADE) ×1 IMPLANT
BLADE SURG 15 STRL SS (BLADE) ×2
CANISTER SUCT 1200ML W/VALVE (MISCELLANEOUS) ×3 IMPLANT
CHLORAPREP W/TINT 26ML (MISCELLANEOUS) ×3 IMPLANT
DRAIN PENROSE 5/8X18 LTX STRL (WOUND CARE) ×3 IMPLANT
DRAPE LAPAROTOMY 77X122 PED (DRAPES) ×3 IMPLANT
ELECT REM PT RETURN 9FT ADLT (ELECTROSURGICAL) ×3
ELECTRODE REM PT RTRN 9FT ADLT (ELECTROSURGICAL) ×1 IMPLANT
GLOVE BIO SURGEON STRL SZ7 (GLOVE) ×3 IMPLANT
GLOVE BIO SURGEON STRL SZ7.5 (GLOVE) ×15 IMPLANT
GOWN STRL REUS W/ TWL LRG LVL3 (GOWN DISPOSABLE) ×5 IMPLANT
GOWN STRL REUS W/TWL LRG LVL3 (GOWN DISPOSABLE) ×10
KIT RM TURNOVER STRD PROC AR (KITS) ×3 IMPLANT
LABEL OR SOLS (LABEL) ×3 IMPLANT
LIQUID BAND (GAUZE/BANDAGES/DRESSINGS) ×3 IMPLANT
MESH SYNTHETIC 4X6 SOFT BARD (Mesh General) ×1 IMPLANT
MESH SYNTHETIC SOFT BARD 4X6 (Mesh General) ×2 IMPLANT
NEEDLE HYPO 25X1 1.5 SAFETY (NEEDLE) ×3 IMPLANT
NS IRRIG 500ML POUR BTL (IV SOLUTION) ×3 IMPLANT
PACK BASIN MINOR ARMC (MISCELLANEOUS) ×3 IMPLANT
SUT CHROMIC 4 0 RB 1X27 (SUTURE) ×3 IMPLANT
SUT MNCRL AB 4-0 PS2 18 (SUTURE) ×6 IMPLANT
SUT SURGILON 0 30 BLK (SUTURE) ×18 IMPLANT
SUT VIC AB 4-0 SH 27 (SUTURE) ×8
SUT VIC AB 4-0 SH 27XANBCTRL (SUTURE) ×4 IMPLANT
SYRINGE 10CC LL (SYRINGE) ×3 IMPLANT

## 2016-06-23 NOTE — Anesthesia Procedure Notes (Signed)
Procedure Name: Intubation Performed by: Tonia GhentOOK-MARTIN, Nhi Butrum Pre-anesthesia Checklist: Patient identified, Emergency Drugs available, Suction available, Patient being monitored and Timeout performed Patient Re-evaluated:Patient Re-evaluated prior to inductionOxygen Delivery Method: Circle system utilized Preoxygenation: Pre-oxygenation with 100% oxygen Intubation Type: IV induction Ventilation: Mask ventilation without difficulty Laryngoscope Size: Miller and 3 Grade View: Grade II Tube type: Oral Tube size: 7.5 mm Number of attempts: 1 Airway Equipment and Method: Stylet Placement Confirmation: ETT inserted through vocal cords under direct vision,  positive ETCO2,  CO2 detector and breath sounds checked- equal and bilateral Secured at: 22 cm Tube secured with: Tape Dental Injury: Teeth and Oropharynx as per pre-operative assessment

## 2016-06-23 NOTE — Op Note (Signed)
OPERATIVE REPORT  PREOPERATIVE DIAGNOSIS: bilateral inguinal hernia  POSTOPERATIVE DIAGNOSIS:bilateral  inguinal hernia  PROCEDURE:  bilateral inguinal hernia repair  ANESTHESIA:  General  SURGEON:  Renda RollsWilton Hadasa Gasner M.D.  INDICATIONS: He has had recent discomfort in the left groin with bulging. He also had a previous right inguinal hernia repair 50 years ago a left inguinal hernia was demonstrated on physical exam and also a recurrent right inguinal hernia was demonstrated. Surgery was recommended for definitive treatment.  With the patient on the operating table in the supine position the bilateral lower quadrant was prepared with clippers and with ChloraPrep and draped in a sterile manner. A transversely oriented left suprapubic incision was made and carried down through subcutaneous tissues. Electrocautery was used for hemostasis. The Scarpa's fascia was incised. The external oblique aponeurosis was incised along the course of its fibers to open the external ring and expose the inguinal cord structures. The cord structures were mobilized. A Penrose drain was passed around the cord structures for traction. Cremaster fibers were spread to expose an indirect hernia sac. The sac was dissected free from surrounding structures. A portion of the sac was made up of sigmoid colon. The sac was approximated 4 cm in length. The sac was dissected free from surrounding structures and was inverted through the internal ring. The repair was carried out with 0 Surgilon sutures suturing the conjoined tendon to the shelving edge of the inguinal ligament incorporating transversalis fascia into the repair. The last stitch led to satisfactory narrowing of the internal ring. Bard soft mesh was cut to create an oval shape and was placed over the repair. This was sutured to the repair with interrupted 0 Surgilon sutures and also sutured medially to the deep fascia and on both sides of the internal ring. Next after seeing  hemostasis was intact the cord structures were replaced along the floor of the inguinal canal. The cut edges of the external oblique aponeurosis were closed with a running 4-0 Vicryl suture to re-create the external ring. The deep fascia superior and lateral to the repair site was infiltrated with half percent Sensorcaine with epinephrine. Subcutaneous tissues were also infiltrated. The Scarpa's fascia was closed with interrupted 4-0 Vicryl sutures. The skin was closed with running 4-0 Monocryl subcuticular suture.  The patient appear to be in satisfactory condition. Next a right-sided transverse suprapubic incision was made and carried down through subcutaneous tissues. Several small bleeding points were cauterized. Scarpa's fascia was incised. . The external ring was identified and the external oblique aponeurosis was incised along the course of its fibers to open the external ring and expose inguinal cord structures.There was extensive scar tissue noted Cremaster fibers were spread to expose an indirect hernia sac which was approximately 10 cm in length and was dissected free from surrounding structures. The sac was bulky and fatty and was inverted back into the peritoneal cavity. The defect was somewhat larger on the right side and extended both above and below the inferior epigastric vessels. The repair was carried out with 0 Surgilon suturing the conjoined tendon to the shelving edge of the inguinal ligament incorporating transversalis fascia into the repair. The last stitch led to satisfactory narrowing of the internal ring. A relaxing incision was made medially.  Bard soft mesh was cut to create an oval shape and was placed over the repair also covering the relaxing incision. This was sutured to the repair with interrupted 0 Surgilon sutures and also sutured medially to the deep fascia and on both sides  of the internal ring. Next after seeing hemostasis was intact the cord structures were replaced along  the floor of the inguinal canal. The cut edges of the external oblique aponeurosis were closed with a running 4-0 Vicryl suture to re-create the external ring. The deep fascia superior and lateral to the repair site was infiltrated with half percent Sensorcaine with epinephrine. Subcutaneous tissues were also infiltrated. The Scarpa's fascia was closed with interrupted 4-0 Vicryl sutures. The skin was closed with running 4-0 Monocryl subcuticular suture. Both wounds were then treated with LiquiBand and allowed to dry. The patient appeared to be in satisfactory condition and was prepared for transfer to the recovery room.  Renda Rolls M.D.

## 2016-06-23 NOTE — Transfer of Care (Signed)
Immediate Anesthesia Transfer of Care Note  Patient: Jack Morrison  Procedure(s) Performed: Procedure(s): HERNIA REPAIR INGUINAL ADULT (Bilateral)  Patient Location: PACU  Anesthesia Type:General  Level of Consciousness: sedated and responds to stimulation  Airway & Oxygen Therapy: Patient Spontanous Breathing and Patient connected to face mask oxygen  Post-op Assessment: Report given to RN and Post -op Vital signs reviewed and stable  Post vital signs: Reviewed and stable  Last Vitals:  Vitals:   06/23/16 1154 06/23/16 1535  BP: (!) 155/97 133/72  Pulse: 93 (!) 106  Resp: 16 14  Temp: 36.4 C 36.9 C    Last Pain:  Vitals:   06/23/16 1535  TempSrc:   PainSc: Asleep         Complications: No apparent anesthesia complications

## 2016-06-23 NOTE — Discharge Instructions (Signed)
Take Tylenol or Norco if needed for pain.  May resume naproxen tomorrow.  May shower.  Avoid straining and heavy lifting.      AMBULATORY SURGERY  DISCHARGE INSTRUCTIONS   1) The drugs that you were given will stay in your system until tomorrow so for the next 24 hours you should not:  A) Drive an automobile B) Make any legal decisions C) Drink any alcoholic beverage   2) You may resume regular meals tomorrow.  Today it is better to start with liquids and gradually work up to solid foods.  You may eat anything you prefer, but it is better to start with liquids, then soup and crackers, and gradually work up to solid foods.   3) Please notify your doctor immediately if you have any unusual bleeding, trouble breathing, redness and pain at the surgery site, drainage, fever, or pain not relieved by medication.    4) Additional Instructions:        Please contact your physician with any problems or Same Day Surgery at [phone], Monday through Friday 6 am to 4 pm, or [place] at [facility] Main number at [phone].

## 2016-06-23 NOTE — Anesthesia Preprocedure Evaluation (Signed)
Anesthesia Evaluation  Patient identified by MRN, date of birth, ID band Patient awake    Reviewed: Allergy & Precautions, H&P , NPO status , Patient's Chart, lab work & pertinent test results  History of Anesthesia Complications Negative for: history of anesthetic complications  Airway Mallampati: III  TM Distance: <3 FB Neck ROM: limited    Dental no notable dental hx. (+) Poor Dentition, Chipped, Missing, Partial Upper   Pulmonary neg pulmonary ROS, neg shortness of breath,    Pulmonary exam normal breath sounds clear to auscultation       Cardiovascular Exercise Tolerance: Good hypertension, (-) angina(-) Past MI and (-) DOE Normal cardiovascular exam Rhythm:regular Rate:Normal     Neuro/Psych negative neurological ROS  negative psych ROS   GI/Hepatic negative GI ROS, Neg liver ROS,   Endo/Other  negative endocrine ROS  Renal/GU      Musculoskeletal   Abdominal   Peds  Hematology negative hematology ROS (+)   Anesthesia Other Findings Past Medical History: No date: Deviated septum     Comment: nasal obstruction No date: Hypertension     Comment: controlled on meds No date: Osteoporosis No date: Rhinitis     Comment: allergic chronic sinusitis/turbinate               hypertrophy No date: Wears partial dentures     Comment: upper  Past Surgical History: No date: CLEFT PALATE REPAIR No date: HERNIA REPAIR 03/30/2016: IMAGE GUIDED SINUS SURGERY N/A     Comment: Procedure:  IMAGE GUIDED SINUS SURGERY WITH               MAXILLARY ANTROSTOMY WITH TISSUE REMOVAL               FRONTAL SINUS EXPLORATION TOTAL ETHMOIDECTOMY;               Surgeon: Vernie MurdersPaul Juengel, MD;  [facility];  Service: ENT;  Laterality: N/A;               Dee gave disk to brenda No date: NASAL SINUS SURGERY No date: SPINAL FUSION  BMI    Body Mass Index:  25.83 kg/m       Reproductive/Obstetrics negative OB ROS                             Anesthesia Physical Anesthesia Plan  ASA: III  Anesthesia Plan: General ETT   Post-op Pain Management:    Induction:   Airway Management Planned:   Additional Equipment:   Intra-op Plan:   Post-operative Plan:   Informed Consent: I have reviewed the patients History and Physical, chart, labs and discussed the procedure including the risks, benefits and alternatives for the proposed anesthesia with the patient or authorized representative who has indicated his/her understanding and acceptance.     Plan Discussed with: Anesthesiologist, CRNA and Surgeon  Anesthesia Plan Comments:         Anesthesia Quick Evaluation

## 2016-06-23 NOTE — Progress Notes (Signed)
Dr. Smith into see 

## 2016-06-23 NOTE — H&P (Signed)
  He reports no change in overall condition since the day of the office exam.  Lab work was noted.  I discussed the plan for bilateral inguinal hernia repair.

## 2016-06-24 NOTE — Anesthesia Postprocedure Evaluation (Signed)
Anesthesia Post Note  Patient: Jack Morrison  Procedure(s) Performed: Procedure(s) (LRB): HERNIA REPAIR INGUINAL ADULT (Bilateral)  Patient location during evaluation: PACU Anesthesia Type: General Level of consciousness: awake and alert Pain management: pain level controlled Vital Signs Assessment: post-procedure vital signs reviewed and stable Respiratory status: spontaneous breathing, nonlabored ventilation, respiratory function stable and patient connected to nasal cannula oxygen Cardiovascular status: blood pressure returned to baseline and stable Postop Assessment: no signs of nausea or vomiting Anesthetic complications: no    Last Vitals:  Vitals:   06/23/16 1643 06/23/16 1700  BP: 139/67 138/62  Pulse: (!) 109 (!) 114  Resp: 16   Temp: 36.4 C     Last Pain:  Vitals:   06/23/16 1643  TempSrc: Tympanic  PainSc: 2                  Lenard SimmerAndrew Lanika Colgate

## 2016-06-27 ENCOUNTER — Encounter: Payer: Self-pay | Admitting: Surgery

## 2016-06-28 ENCOUNTER — Encounter: Payer: Self-pay | Admitting: Surgery

## 2018-07-11 IMAGING — US US EXTREM LOW*L* LIMITED
1 series · 11 of 11 positions shown · non-contrast
Comparison: None

CLINICAL DATA: Left inguinal pain.  Evaluate for hernia

EXAM:
ULTRASOUND LEFT LOWER EXTREMITY LIMITED
TECHNIQUE: Ultrasound examination of the lower extremity soft tissues was
performed in the area of clinical concern.

[Series 1: us extrem low*left* limited · 0.08mm/px · 11 acquisitions, 11 frames shown]
[im 1/11]
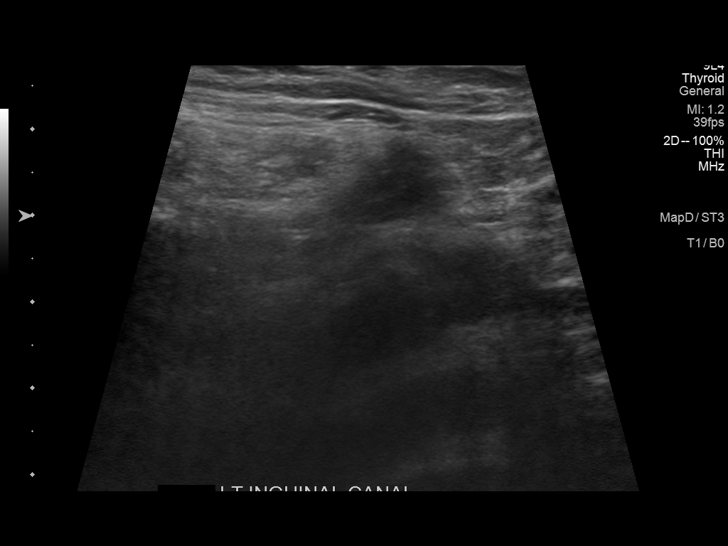
[im 2/11]
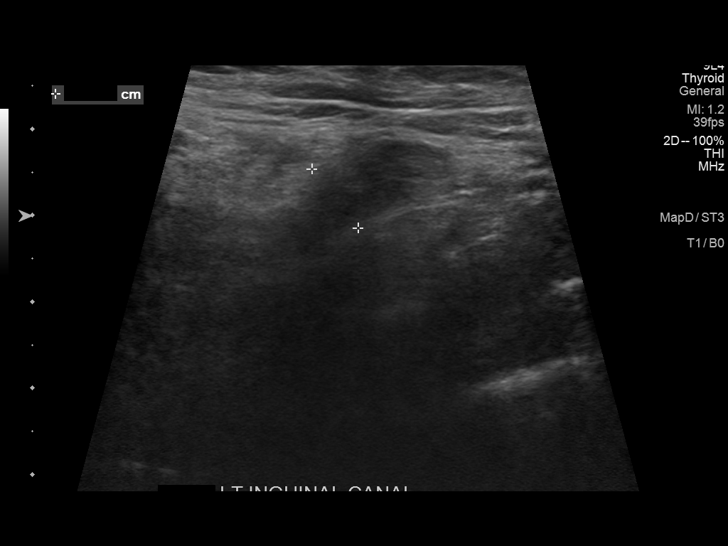
[im 3/11]
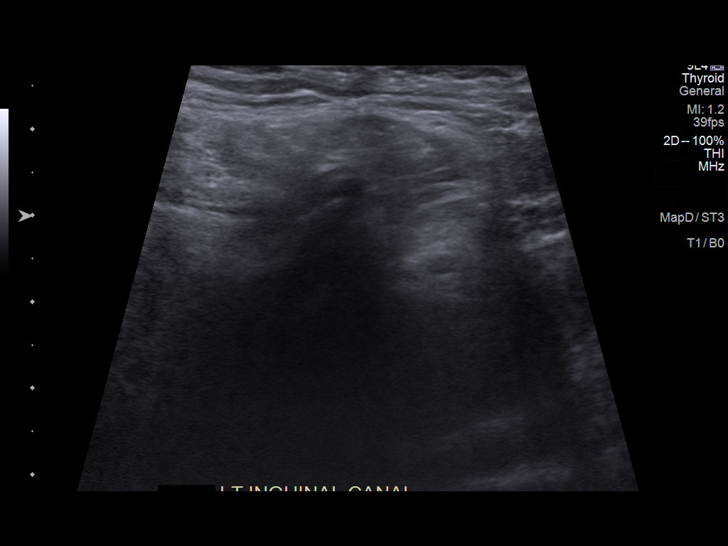
[im 4/11]
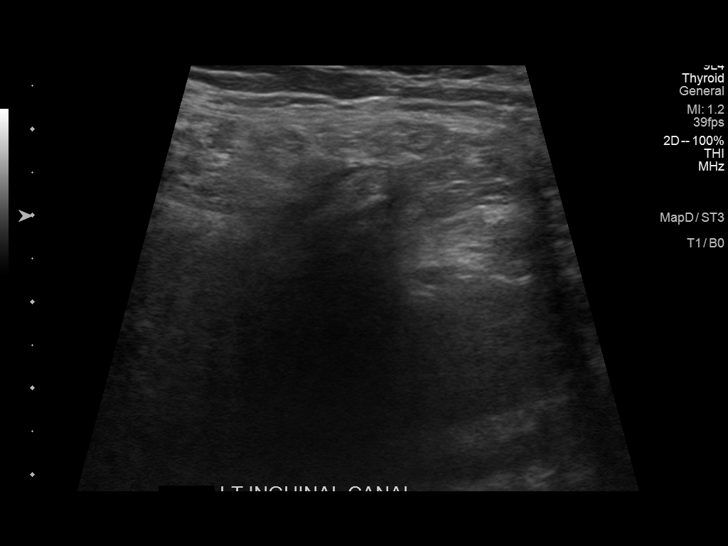
[im 5/11]
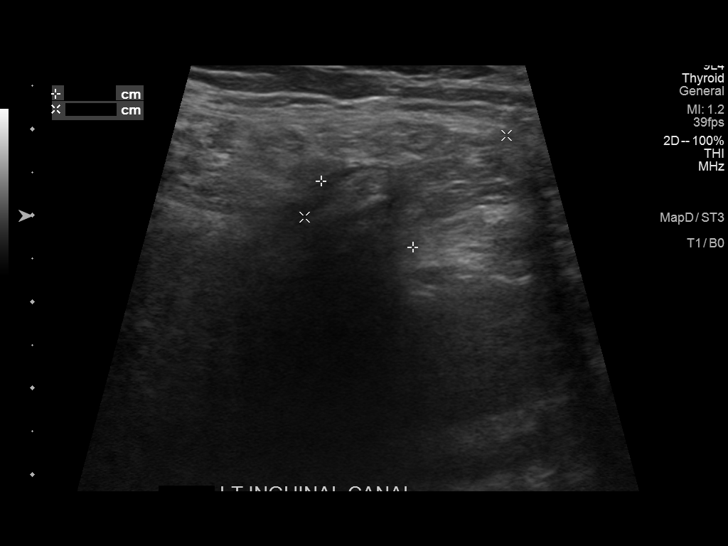
[im 6/11]
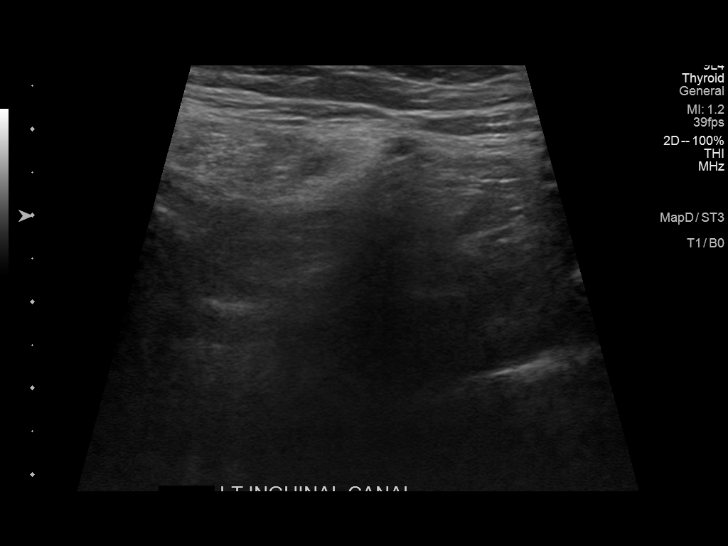
[im 7/11]
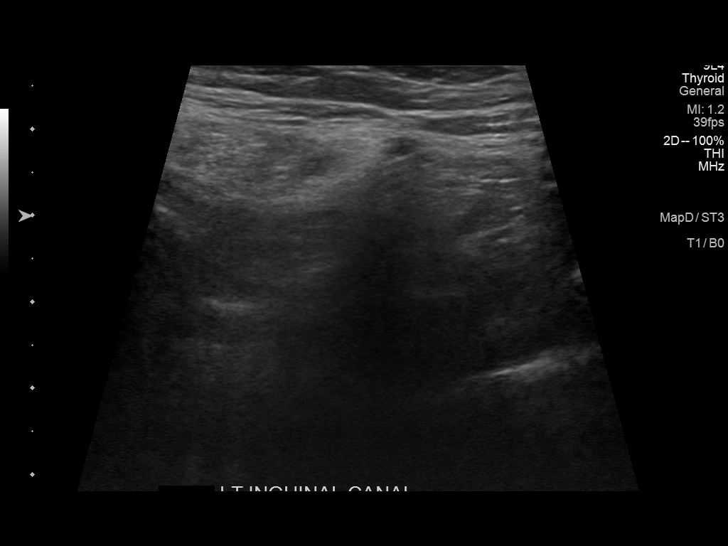
[im 8/11]
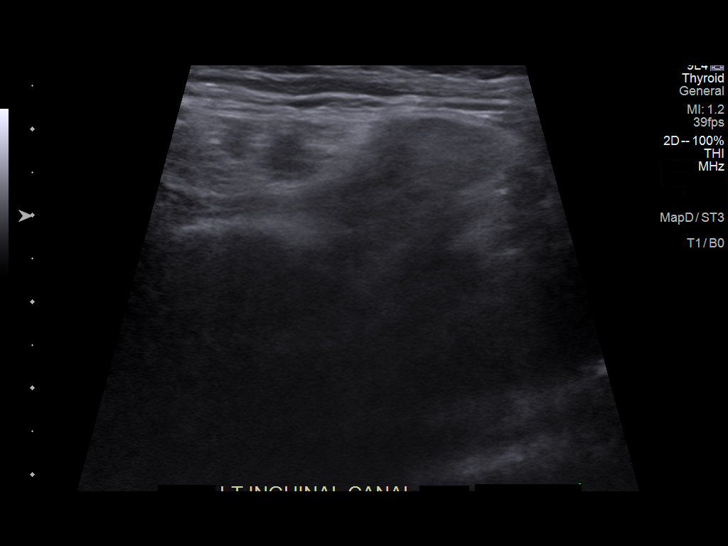
[im 9/11]
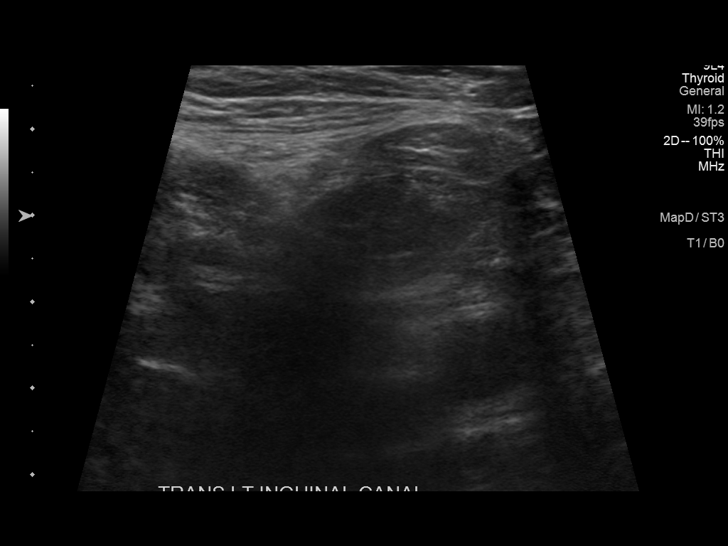
[im 10/11]
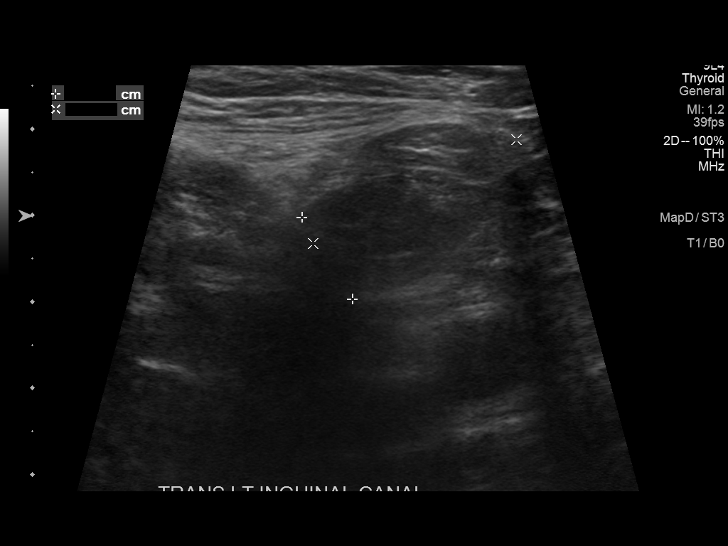
[im 11/11]
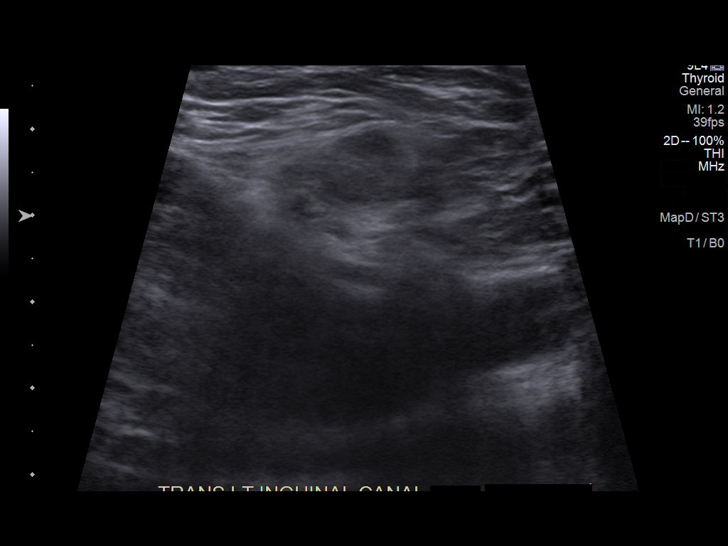

[11 of 11 positions shown; findings below may reference images not displayed]

FINDINGS: Ultrasound performed in the left inguinal region. This demonstrates
and hernia noted within the left inguinal region with Valsalva. This
hernia likely contains fat. No definite bowel seen within the
hernia.
IMPRESSION: Left inguinal hernia noted with Valsalva, likely containing fat.

## 2023-02-05 ENCOUNTER — Encounter: Payer: Self-pay | Admitting: *Deleted

## 2023-02-06 ENCOUNTER — Encounter
Admission: RE | Disposition: A | Discharge status: Home / Self Care | Payer: Self-pay | Source: Home / Self Care | Attending: Gastroenterology

## 2023-02-06 ENCOUNTER — Ambulatory Visit: Payer: BC Managed Care – PPO | Admitting: Anesthesiology

## 2023-02-06 ENCOUNTER — Other Ambulatory Visit: Payer: Self-pay

## 2023-02-06 ENCOUNTER — Ambulatory Visit
Admission: RE | Admit: 2023-02-06 | Discharge: 2023-02-06 | Disposition: A | Discharge status: Home / Self Care | Payer: BC Managed Care – PPO | Attending: Gastroenterology | Admitting: Gastroenterology

## 2023-02-06 ENCOUNTER — Encounter: Payer: Self-pay | Admitting: *Deleted

## 2023-02-06 DIAGNOSIS — Z1211 Encounter for screening for malignant neoplasm of colon: Secondary | ICD-10-CM | POA: Diagnosis not present

## 2023-02-06 DIAGNOSIS — K640 First degree hemorrhoids: Secondary | ICD-10-CM | POA: Insufficient documentation

## 2023-02-06 DIAGNOSIS — J309 Allergic rhinitis, unspecified: Secondary | ICD-10-CM | POA: Insufficient documentation

## 2023-02-06 DIAGNOSIS — I10 Essential (primary) hypertension: Secondary | ICD-10-CM | POA: Insufficient documentation

## 2023-02-06 HISTORY — PX: COLONOSCOPY WITH PROPOFOL: SHX5780

## 2023-02-06 HISTORY — DX: Hyperlipidemia, unspecified: E78.5

## 2023-02-06 SURGERY — COLONOSCOPY WITH PROPOFOL
Anesthesia: General

## 2023-02-06 MED ORDER — PHENYLEPHRINE 80 MCG/ML (10ML) SYRINGE FOR IV PUSH (FOR BLOOD PRESSURE SUPPORT)
PREFILLED_SYRINGE | INTRAVENOUS | Status: AC
  Filled 2023-02-06: qty 10

## 2023-02-06 MED ORDER — EPHEDRINE 5 MG/ML INJ
INTRAVENOUS | Status: AC
  Filled 2023-02-06: qty 5

## 2023-02-06 MED ORDER — PROPOFOL 500 MG/50ML IV EMUL
INTRAVENOUS | Status: DC | PRN
  Administered 2023-02-06: 150 ug/kg/min via INTRAVENOUS

## 2023-02-06 MED ORDER — EPHEDRINE SULFATE (PRESSORS) 50 MG/ML IJ SOLN
INTRAMUSCULAR | Status: DC | PRN
  Administered 2023-02-06 (×3): 10 mg via INTRAVENOUS

## 2023-02-06 MED ORDER — PHENYLEPHRINE HCL (PRESSORS) 10 MG/ML IV SOLN
INTRAVENOUS | Status: DC | PRN
  Administered 2023-02-06 (×3): 100 ug via INTRAVENOUS
  Administered 2023-02-06 (×2): 50 ug via INTRAVENOUS

## 2023-02-06 MED ORDER — PROPOFOL 1000 MG/100ML IV EMUL
INTRAVENOUS | Status: AC
  Filled 2023-02-06: qty 100

## 2023-02-06 MED ORDER — SODIUM CHLORIDE 0.9 % IV SOLN: INTRAVENOUS | Status: DC

## 2023-02-06 NOTE — Op Note (Addendum)
[facility] Gastroenterology Patient Name: Jack Morrison Procedure Date: 02/06/2023 10:29 AM MRN: [id number] Account #: [id number] Date of Birth: 03-23-1961 Admit Type: Outpatient Age: 62 Room: [facility] ENDO ROOM 1 Gender: Male Note Status: Finalized Instrument Name: Prentice Docker [id number] Procedure:             Colonoscopy Indications:           Screening for colorectal malignant neoplasm Providers:             Eather Colas MD, MD Referring MD:          No PCP Medicines:             Monitored Anesthesia Care Complications:         No immediate complications. Procedure:             Pre-Anesthesia Assessment:                        - Prior to the procedure, a History and Physical was                         performed, and patient medications and allergies were                         reviewed. The patient is competent. The risks and                         benefits of the procedure and the sedation options and                         risks were discussed with the patient. All questions                         were answered and informed consent was obtained.                         Patient identification and proposed procedure were                         verified by the physician, the nurse, the                         anesthesiologist, the anesthetist and the technician                         in the endoscopy suite. Mental Status Examination:                         alert and oriented. Airway Examination: normal                         oropharyngeal airway and neck mobility. Respiratory                         Examination: clear to auscultation. CV Examination:                         normal. Prophylactic Antibiotics: The patient does not  require prophylactic antibiotics. Prior                         Anticoagulants: The patient has taken no anticoagulant                         or antiplatelet agents. ASA Grade Assessment: II - A                          patient with mild systemic disease. After reviewing                         the risks and benefits, the patient was deemed in                         satisfactory condition to undergo the procedure. The                         anesthesia plan was to use monitored anesthesia care                         (MAC). Immediately prior to administration of                         medications, the patient was re-assessed for adequacy                         to receive sedatives. The heart rate, respiratory                         rate, oxygen saturations, blood pressure, adequacy of                         pulmonary ventilation, and response to care were                         monitored throughout the procedure. The physical                         status of the patient was re-assessed after the                         procedure.                        After obtaining informed consent, the colonoscope was                         passed under direct vision. Throughout the procedure,                         the patient's blood pressure, pulse, and oxygen                         saturations were monitored continuously. The                         Colonoscope was introduced through the anus and  advanced to the the terminal ileum. The colonoscopy                         was somewhat difficult due to a tortuous colon.                         Successful completion of the procedure was aided by                         applying abdominal pressure. The patient tolerated the                         procedure well. The quality of the bowel preparation                         was adequate to identify polyps. The terminal ileum,                         ileocecal valve, appendiceal orifice, and rectum were                         photographed. Findings:      The perianal and digital rectal examinations were normal.      The terminal ileum appeared normal.      Internal  hemorrhoids were found during retroflexion. The hemorrhoids       were Grade I (internal hemorrhoids that do not prolapse).      The exam was otherwise without abnormality on direct and retroflexion       views. Impression:            - The examined portion of the ileum was normal.                        - Internal hemorrhoids.                        - The examination was otherwise normal on direct and                         retroflexion views.                        - No specimens collected. Recommendation:        - Discharge patient to home.                        - Resume previous diet.                        - Continue present medications.                        - Repeat colonoscopy in 10 years for screening                         purposes.                        - Return to referring physician as previously  scheduled. Procedure Code(s):     --- Professional ---                        Z6109, Colorectal cancer screening; colonoscopy on                         individual not meeting criteria for high risk Diagnosis Code(s):     --- Professional ---                        Z12.11, Encounter for screening for malignant neoplasm                         of colon                        K64.0, First degree hemorrhoids CPT copyright 2022 [employer]. All rights reserved. The codes documented in this report are preliminary and upon coder review may  be revised to meet current compliance requirements. Eather Colas MD, MD 02/06/2023 11:22:31 AM Number of Addenda: 0 Note Initiated On: 02/06/2023 10:29 AM Scope Withdrawal Time: 0 hours 10 minutes 17 seconds  Total Procedure Duration: 0 hours 21 minutes 32 seconds  Estimated Blood Loss:  Estimated blood loss: none.      [facility]

## 2023-02-06 NOTE — Anesthesia Preprocedure Evaluation (Signed)
Anesthesia Evaluation  Patient identified by MRN, date of birth, ID band Patient awake    Reviewed: Allergy & Precautions, H&P , NPO status , Patient's Chart, lab work & pertinent test results  Airway Mallampati: II  TM Distance: >3 FB Neck ROM: full    Dental   Pulmonary neg pulmonary ROS   Pulmonary exam normal        Cardiovascular hypertension, Normal cardiovascular exam     Neuro/Psych negative neurological ROS  negative psych ROS   GI/Hepatic negative GI ROS, Neg liver ROS,,,  Endo/Other  negative endocrine ROS    Renal/GU negative Renal ROS  negative genitourinary   Musculoskeletal   Abdominal   Peds  Hematology negative hematology ROS (+)   Anesthesia Other Findings Past Medical History: No date: Deviated septum     Comment:  nasal obstruction No date: Hypertension     Comment:  controlled on meds No date: Osteoporosis No date: Rhinitis     Comment:  allergic chronic sinusitis/turbinate hypertrophy No date: Wears partial dentures     Comment:  upper  Past Surgical History: No date: BACK SURGERY No date: CLEFT PALATE REPAIR No date: HERNIA REPAIR 03/30/2016: IMAGE GUIDED SINUS SURGERY; N/A     Comment:  Procedure:  IMAGE GUIDED SINUS SURGERY WITH MAXILLARY               ANTROSTOMY WITH TISSUE REMOVAL FRONTAL SINUS EXPLORATION               TOTAL ETHMOIDECTOMY;  Surgeon: Vernie Murders, MD;                Location: [facility];  Service: ENT;                Laterality: N/A;  Dee gave disk to brenda 06/23/2016: INGUINAL HERNIA REPAIR; Bilateral     Comment:  Procedure: HERNIA REPAIR INGUINAL ADULT;  Surgeon:               Nadeen Landau, MD;  Location: [facility] ORS;  Service:               General;  Laterality: Bilateral; No date: NASAL SINUS SURGERY No date: SPINAL FUSION     Reproductive/Obstetrics negative OB ROS                             Anesthesia  Physical Anesthesia Plan  ASA: 2  Anesthesia Plan: General   Post-op Pain Management:    Induction:   PONV Risk Score and Plan: Propofol infusion and TIVA  Airway Management Planned:   Additional Equipment:   Intra-op Plan:   Post-operative Plan:   Informed Consent: I have reviewed the patients History and Physical, chart, labs and discussed the procedure including the risks, benefits and alternatives for the proposed anesthesia with the patient or authorized representative who has indicated his/her understanding and acceptance.     Dental Advisory Given  Plan Discussed with: CRNA and Surgeon  Anesthesia Plan Comments:         Anesthesia Quick Evaluation

## 2023-02-06 NOTE — Interval H&P Note (Signed)
History and Physical Interval Note:  02/06/2023 10:46 AM  Jack Morrison  has presented today for surgery, with the diagnosis of CCA SCREEN.  The various methods of treatment have been discussed with the patient and family. After consideration of risks, benefits and other options for treatment, the patient has consented to  Procedure(s): COLONOSCOPY WITH PROPOFOL (N/A) as a surgical intervention.  The patient's history has been reviewed, patient examined, no change in status, stable for surgery.  I have reviewed the patient's chart and labs.  Questions were answered to the patient's satisfaction.     Regis Bill  Ok to proceed with colonoscopy

## 2023-02-06 NOTE — H&P (Signed)
Outpatient short stay form Pre-procedure 02/06/2023  Regis Bill, MD  Primary Physician: Pcp, No  Reason for visit:  Screening  History of present illness:    62 y/o gentleman with history of hypertension here for screening colonoscopy. Last colonoscopy in 2011 was normal. No family history of GI malignancies. No blood thinners. History of inguinal hernia repair.    Current Facility-Administered Medications:    0.9 %  sodium chloride infusion, , Intravenous, Continuous, Shyam Dawson, Rossie Muskrat, MD, Last Rate: 20 mL/hr at 02/06/23 1027, New Bag at 02/06/23 1027  Medications Prior to Admission  Medication Sig Dispense Refill Last Dose   Calcium Carb-Cholecalciferol (CALCIUM 600+D) 600-800 MG-UNIT TABS Take 1 tablet by mouth at bedtime.   Past Week   cetirizine (ZYRTEC) 10 MG tablet Take 10 mg by mouth every evening.   Past Week   Cholecalciferol (VITAMIN D3) 50000 units TABS Take by mouth once a week.   Past Week   docusate sodium (COLACE) 100 MG capsule Take 100 mg by mouth at bedtime.   Past Week   fluticasone (FLONASE) 50 MCG/ACT nasal spray Place 2 sprays into both nostrils daily.   02/05/2023   hydrochlorothiazide (HYDRODIURIL) 25 MG tablet Take 25 mg by mouth daily.   02/06/2023   Lactobacillus (ACIDOPHILUS) 100 MG CAPS Take 1 capsule by mouth at bedtime.   Past Week   lisinopril (ZESTRIL) 10 MG tablet Take 10 mg by mouth daily.   02/06/2023   naproxen sodium (ANAPROX) 220 MG tablet Take 220 mg by mouth 2 (two) times daily with a meal. Reported on 03/23/2016   Past Week   simvastatin (ZOCOR) 20 MG tablet Take 20 mg by mouth daily. Pm   02/05/2023   acetaminophen (TYLENOL) 500 MG tablet Take 500 mg by mouth every 6 (six) hours as needed.      hydrochlorothiazide (MICROZIDE) 12.5 MG capsule Take 12.5 mg by mouth at bedtime.       HYDROcodone-acetaminophen (NORCO) 5-325 MG tablet Take 1-2 tablets by mouth every 4 (four) hours as needed for moderate pain. 20 tablet 0      Allergies   Allergen Reactions   Cleocin [Clindamycin Hcl] Hives   Penicillins Swelling   Sulfa Antibiotics Swelling     Past Medical History:  Diagnosis Date   Deviated septum    nasal obstruction   HLD (hyperlipidemia)    Hypertension    controlled on meds   Osteoporosis    Rhinitis    allergic chronic sinusitis/turbinate hypertrophy   Wears partial dentures    upper    Review of systems:  Otherwise negative.    Physical Exam  Gen: Alert, oriented. Appears stated age.  HEENT: PERRLA. Lungs: No respiratory distress CV: RRR Abd: soft, benign, no masses Ext: No edema    Planned procedures: Proceed with colonoscopy. The patient understands the nature of the planned procedure, indications, risks, alternatives and potential complications including but not limited to bleeding, infection, perforation, damage to internal organs and possible oversedation/side effects from anesthesia. The patient agrees and gives consent to proceed.  Please refer to procedure notes for findings, recommendations and patient disposition/instructions.     Regis Bill, MD [facility] Gastroenterology

## 2023-02-06 NOTE — Transfer of Care (Signed)
Immediate Anesthesia Transfer of Care Note  Patient: Jack Morrison  Procedure(s) Performed: COLONOSCOPY WITH PROPOFOL  Patient Location: PACU  Anesthesia Type:General  Level of Consciousness: awake and sedated  Airway & Oxygen Therapy: Patient Spontanous Breathing and Patient connected to nasal cannula oxygen  Post-op Assessment: Report given to RN and Post -op Vital signs reviewed and stable  Post vital signs: Reviewed and stable  Last Vitals:  Vitals Value Taken Time  BP    Temp    Pulse    Resp    SpO2      Last Pain:  Vitals:   02/06/23 1026  TempSrc:   PainSc: 0-No pain         Complications: There were no known notable events for this encounter.

## 2023-02-07 ENCOUNTER — Encounter: Payer: Self-pay | Admitting: Gastroenterology

## 2023-02-07 NOTE — Anesthesia Postprocedure Evaluation (Signed)
Anesthesia Post Note  Patient: MANLY NESTLE  Procedure(s) Performed: COLONOSCOPY WITH PROPOFOL  Patient location during evaluation: Endoscopy Anesthesia Type: General Level of consciousness: awake and alert Pain management: pain level controlled Vital Signs Assessment: post-procedure vital signs reviewed and stable Respiratory status: spontaneous breathing, nonlabored ventilation and respiratory function stable Cardiovascular status: blood pressure returned to baseline and stable Postop Assessment: no apparent nausea or vomiting Anesthetic complications: no   There were no known notable events for this encounter.   Last Vitals:  Vitals:   02/06/23 1117 02/06/23 1144  BP: (!) 98/55 117/74  Resp: 18   Temp: (!) 35.6 C   SpO2: 97% 96%    Last Pain:  Vitals:   02/06/23 1149  TempSrc:   PainSc: 0-No pain                 Foye Deer

## 2024-01-22 ENCOUNTER — Other Ambulatory Visit: Payer: Self-pay | Admitting: Podiatry

## 2024-01-22 DIAGNOSIS — L03031 Cellulitis of right toe: Secondary | ICD-10-CM

## 2024-01-30 ENCOUNTER — Encounter: Payer: Self-pay | Admitting: Podiatry

## 2024-02-04 ENCOUNTER — Ambulatory Visit
Admission: RE | Admit: 2024-02-04 | Discharge: 2024-02-04 | Disposition: A | Discharge status: Home / Self Care | Source: Ambulatory Visit | Attending: Podiatry | Admitting: Podiatry

## 2024-02-04 DIAGNOSIS — L03031 Cellulitis of right toe: Secondary | ICD-10-CM

## 2024-05-27 ENCOUNTER — Other Ambulatory Visit: Payer: Self-pay | Admitting: Podiatry

## 2024-06-02 ENCOUNTER — Encounter: Payer: Self-pay | Admitting: Podiatry

## 2024-06-02 ENCOUNTER — Other Ambulatory Visit: Payer: Self-pay

## 2024-06-09 NOTE — Discharge Instructions (Signed)

## 2024-06-11 ENCOUNTER — Encounter
Admission: RE | Disposition: A | Discharge status: Home / Self Care | Payer: Self-pay | Source: Home / Self Care | Attending: Podiatry

## 2024-06-11 ENCOUNTER — Ambulatory Visit
Admission: RE | Admit: 2024-06-11 | Discharge: 2024-06-11 | Disposition: A | Discharge status: Home / Self Care | Attending: Podiatry | Admitting: Podiatry

## 2024-06-11 ENCOUNTER — Other Ambulatory Visit: Payer: Self-pay

## 2024-06-11 ENCOUNTER — Ambulatory Visit: Payer: Self-pay | Admitting: Anesthesiology

## 2024-06-11 ENCOUNTER — Encounter: Payer: Self-pay | Admitting: Podiatry

## 2024-06-11 ENCOUNTER — Ambulatory Visit: Payer: Self-pay

## 2024-06-11 DIAGNOSIS — L02611 Cutaneous abscess of right foot: Secondary | ICD-10-CM | POA: Insufficient documentation

## 2024-06-11 DIAGNOSIS — I10 Essential (primary) hypertension: Secondary | ICD-10-CM | POA: Diagnosis not present

## 2024-06-11 DIAGNOSIS — D489 Neoplasm of uncertain behavior, unspecified: Secondary | ICD-10-CM | POA: Diagnosis present

## 2024-06-11 DIAGNOSIS — R7303 Prediabetes: Secondary | ICD-10-CM | POA: Diagnosis not present

## 2024-06-11 HISTORY — PX: BONE EXOSTOSIS EXCISION: SHX1249

## 2024-06-11 SURGERY — EXCISION, EXOSTOSIS
Anesthesia: General | Site: Foot | Laterality: Right

## 2024-06-11 MED ORDER — MIDAZOLAM HCL 2 MG/2ML IJ SOLN
INTRAMUSCULAR | Status: DC | PRN
  Administered 2024-06-11: 2 mg via INTRAVENOUS

## 2024-06-11 MED ORDER — OXYCODONE HCL 5 MG/5ML PO SOLN: 5.0000 mg | Freq: Once | ORAL | Status: DC | PRN

## 2024-06-11 MED ORDER — BUPIVACAINE LIPOSOME 1.3 % IJ SUSP
INTRAMUSCULAR | Status: AC
Start: 2024-06-11 — End: 2024-06-11
  Filled 2024-06-11: qty 10

## 2024-06-11 MED ORDER — CEFAZOLIN SODIUM-DEXTROSE 2-3 GM-%(50ML) IV SOLR
INTRAVENOUS | Status: AC
  Filled 2024-06-11: qty 50

## 2024-06-11 MED ORDER — OXYCODONE HCL 5 MG PO TABS: 5.0000 mg | ORAL_TABLET | Freq: Once | ORAL | Status: DC | PRN

## 2024-06-11 MED ORDER — SODIUM CHLORIDE 0.9 % IR SOLN
Status: DC | PRN
  Administered 2024-06-11: 500 mL

## 2024-06-11 MED ORDER — LACTATED RINGERS IV SOLN: INTRAVENOUS | Status: DC | PRN

## 2024-06-11 MED ORDER — LIDOCAINE HCL (CARDIAC) PF 100 MG/5ML IV SOSY
PREFILLED_SYRINGE | INTRAVENOUS | Status: DC | PRN
Start: 2024-06-11 — End: 2024-06-11
  Administered 2024-06-11: 60 mg via INTRAVENOUS

## 2024-06-11 MED ORDER — MIDAZOLAM HCL 2 MG/2ML IJ SOLN
INTRAMUSCULAR | Status: AC
Start: 2024-06-11 — End: 2024-06-11
  Filled 2024-06-11: qty 2

## 2024-06-11 MED ORDER — FENTANYL CITRATE PF 50 MCG/ML IJ SOSY: 25.0000 ug | PREFILLED_SYRINGE | INTRAMUSCULAR | Status: DC | PRN

## 2024-06-11 MED ORDER — SODIUM CHLORIDE 0.9 % IV SOLN: INTRAVENOUS | Status: DC | PRN

## 2024-06-11 MED ORDER — LIDOCAINE-EPINEPHRINE 2 %-1:100000 IJ SOLN
INTRAMUSCULAR | Status: DC | PRN
Start: 2024-06-11 — End: 2024-06-11
  Administered 2024-06-11: 2.5 mL via INTRADERMAL

## 2024-06-11 MED ORDER — CEFAZOLIN SODIUM-DEXTROSE 2-4 GM/100ML-% IV SOLN
2.0000 g | INTRAVENOUS | Status: AC
  Administered 2024-06-11: 2 g via INTRAVENOUS

## 2024-06-11 MED ORDER — PROPOFOL 10 MG/ML IV BOLUS
INTRAVENOUS | Status: DC | PRN
Start: 2024-06-11 — End: 2024-06-11
  Administered 2024-06-11 (×3): 20 mg via INTRAVENOUS

## 2024-06-11 MED ORDER — FENTANYL CITRATE (PF) 100 MCG/2ML IJ SOLN
INTRAMUSCULAR | Status: DC | PRN
  Administered 2024-06-11 (×2): 50 ug via INTRAVENOUS

## 2024-06-11 MED ORDER — FENTANYL CITRATE (PF) 100 MCG/2ML IJ SOLN
INTRAMUSCULAR | Status: AC
  Filled 2024-06-11: qty 2

## 2024-06-11 MED ORDER — BUPIVACAINE HCL (PF) 0.5 % IJ SOLN
INTRAMUSCULAR | Status: DC | PRN
  Administered 2024-06-11: 2.5 mL

## 2024-06-11 MED ORDER — PROPOFOL 500 MG/50ML IV EMUL
INTRAVENOUS | Status: DC | PRN
Start: 2024-06-11 — End: 2024-06-11
  Administered 2024-06-11: 140 ug/kg/min via INTRAVENOUS

## 2024-06-11 SURGICAL SUPPLY — 27 items
BENZOIN TINCTURE PRP APPL 2/3 (GAUZE/BANDAGES/DRESSINGS) ×1 IMPLANT
BNDG COHESIVE 4X5 TAN STRL LF (GAUZE/BANDAGES/DRESSINGS) ×1 IMPLANT
BNDG ELASTIC 4X5.8 VLCR NS LF (GAUZE/BANDAGES/DRESSINGS) ×1 IMPLANT
BNDG ESMARCH 4X12 STRL LF (GAUZE/BANDAGES/DRESSINGS) ×1 IMPLANT
BNDG GAUZE DERMACEA FLUFF 4 (GAUZE/BANDAGES/DRESSINGS) ×1 IMPLANT
BNDG STRETCH 4X75 STRL LF (GAUZE/BANDAGES/DRESSINGS) ×1 IMPLANT
CANISTER SUCT 1200ML W/VALVE (MISCELLANEOUS) ×1 IMPLANT
COVER LIGHT HANDLE UNIVERSAL (MISCELLANEOUS) ×2 IMPLANT
DRAPE FLUOR MINI C-ARM 54X84 (DRAPES) ×1 IMPLANT
DURAPREP 26ML APPLICATOR (WOUND CARE) ×1 IMPLANT
ELECTRODE REM PT RTRN 9FT ADLT (ELECTROSURGICAL) ×1 IMPLANT
GAUZE SPONGE 4X4 12PLY STRL (GAUZE/BANDAGES/DRESSINGS) ×1 IMPLANT
GAUZE XEROFORM 1X8 LF (GAUZE/BANDAGES/DRESSINGS) ×1 IMPLANT
GLOVE BIOGEL PI IND STRL 8 (GLOVE) ×2 IMPLANT
GLOVE SURG SS PI 7.5 STRL IVOR (GLOVE) ×2 IMPLANT
GOWN SPEC L4 XLG W/TWL (GOWN DISPOSABLE) ×1 IMPLANT
GOWN STRL REUS W/ TWL LRG LVL3 (GOWN DISPOSABLE) ×1 IMPLANT
KIT TURNOVER KIT A (KITS) ×1 IMPLANT
NS IRRIG 500ML POUR BTL (IV SOLUTION) ×1 IMPLANT
PACK EXTREMITY ARMC (MISCELLANEOUS) ×1 IMPLANT
PENCIL SMOKE EVACUATOR (MISCELLANEOUS) ×1 IMPLANT
STOCKINETTE IMPERVIOUS LG (DRAPES) ×1 IMPLANT
STRIP CLOSURE SKIN 1/4X4 (GAUZE/BANDAGES/DRESSINGS) ×1 IMPLANT
SUT ETHILON 4 0 PS 2 18 (SUTURE) IMPLANT
SUT VIC AB 3-0 SH 27X BRD (SUTURE) IMPLANT
SUTURE ETHLN 4-0 FS2 18XMF BLK (SUTURE) IMPLANT
SUTURE MNCRL 4-0 27XMF (SUTURE) ×1 IMPLANT

## 2024-06-11 NOTE — Anesthesia Preprocedure Evaluation (Addendum)
 Anesthesia Evaluation  Patient identified by MRN, date of birth, ID band Patient awake    Reviewed: Allergy & Precautions, NPO status , Patient's Chart, lab work & pertinent test results  History of Anesthesia Complications Negative for: history of anesthetic complications  Airway Mallampati: IV  TM Distance: >3 FB Neck ROM: full    Dental  (+) Partial Upper   Pulmonary neg pulmonary ROS   Pulmonary exam normal        Cardiovascular hypertension, On Medications Normal cardiovascular exam     Neuro/Psych negative neurological ROS  negative psych ROS   GI/Hepatic negative GI ROS, Neg liver ROS,,,  Endo/Other  negative endocrine ROS    Renal/GU negative Renal ROS  negative genitourinary   Musculoskeletal   Abdominal   Peds  Hematology negative hematology ROS (+)   Anesthesia Other Findings Past Medical History: No date: Deviated septum     Comment:  nasal obstruction No date: HLD (hyperlipidemia) No date: Hypertension     Comment:  controlled on meds No date: Osteoporosis No date: Rhinitis     Comment:  allergic chronic sinusitis/turbinate hypertrophy No date: Wears partial dentures     Comment:  upper  Past Surgical History: No date: BACK SURGERY No date: CLEFT PALATE REPAIR No date: COLONOSCOPY 02/06/2023: COLONOSCOPY WITH PROPOFOL ; N/A     Comment:  Procedure: COLONOSCOPY WITH PROPOFOL ;  Surgeon:               Maryruth Ole DASEN, MD;  Location: [facility] END[facility];                Service: Endoscopy;  Laterality: N/A; No date: HERNIA REPAIR; Bilateral     Comment:  several years 03/30/2016: IMAGE GUIDED SINUS SURGERY; N/A     Comment:  Procedure:  IMAGE GUIDED SINUS SURGERY WITH MAXILLARY               ANTROSTOMY WITH TISSUE REMOVAL FRONTAL SINUS EXPLORATION               TOTAL ETHMOIDECTOMY;  Surgeon: Deward Argue, MD;                Location: [facility];  Service: ENT;                 Laterality: N/A;  Dee gave disk to brenda 06/23/2016: INGUINAL HERNIA REPAIR; Bilateral     Comment:  Procedure: HERNIA REPAIR INGUINAL ADULT;  Surgeon:               Larinda Unknown Sharps, MD;  Location: [facility] ORS;  Service:               General;  Laterality: Bilateral; No date: NASAL SINUS SURGERY No date: SPINAL FUSION  BMI    Body Mass Index: 25.83 kg/m      Reproductive/Obstetrics negative OB ROS                              Anesthesia Physical Anesthesia Plan  ASA: 2  Anesthesia Plan: General   Post-op Pain Management: Minimal or no pain anticipated   Induction: Intravenous  PONV Risk Score and Plan: 2 and Propofol  infusion and TIVA  Airway Management Planned: Natural Airway and Nasal Cannula  Additional Equipment:   Intra-op Plan:   Post-operative Plan:   Informed Consent: I have reviewed the patients History and Physical, chart, labs and discussed the procedure including the risks, benefits and alternatives for the  proposed anesthesia with the patient or authorized representative who has indicated his/her understanding and acceptance.     Dental Advisory Given  Plan Discussed with: Anesthesiologist, CRNA and Surgeon  Anesthesia Plan Comments: (Patient consented for risks of anesthesia including but not limited to:  - adverse reactions to medications - risk of airway placement if required - damage to eyes, teeth, lips or other oral mucosa - nerve damage due to positioning  - sore throat or hoarseness - Damage to heart, brain, nerves, lungs, other parts of body or loss of life  Patient voiced understanding and assent.)         Anesthesia Quick Evaluation

## 2024-06-11 NOTE — Op Note (Signed)
 Operative note   Surgeon:Tomeka Kantner [employer] forces logistics/support/administrative officer: None    Preop diagnosis: Soft tissue mass distal right fifth MTPJ region    Postop diagnosis: Same    Procedure: 1.  Excision soft tissue mass to subcutaneous tissue distal right fifth MTPJ 2.  Intermediate closure of wound distal right fifth MTPJ    EBL: None    Anesthesia:local and IV sedation.  Local consist of a total of 5 cc of one-to-one mixture of 0.5% plain bupivacaine  and 2% lidocaine  with epinephrine     Hemostasis: Lidocaine  with epinephrine     Specimen: Soft tissue mass with proximal margin tagged and deep wound culture    Complications: None    Operative indications:Jack Morrison is an 63 y.o. that presents today for surgical intervention.  The risks/benefits/alternatives/complications have been discussed and consent has been given.    Procedure:  Patient was brought into the OR and placed on the operating table in thesupine position. After anesthesia was obtained theright lower extremity was prepped and draped in usual sterile fashion.  Attention was directed to the lateral aspect of the right foot where a small soft tissue mass was noted.  A 3-1 incision was mapped out.  The incision site was approximately 2 cm in length.  Full-thickness flaps were created down into the subcutaneous tissue and blunt dissection carried out to the deeper tissue.  The mass was then removed from the surgical field in toto.  Bleeders were Bovie cauterized as appropriate.  The proximal margin of the skin was tagged with a nylon suture.  At this time after final flush intermediate closure was performed with a 3-0 Vicryl the deeper and subcutaneous tissue and a 3-0 nylon for the skin.  A bulky sterile dressing was applied.    Patient tolerated the procedure and anesthesia well.  Was transported from the OR to the PACU with all vital signs stable and vascular status intact. To be discharged per routine protocol.  Will follow up in approximately 1  week in the outpatient clinic.

## 2024-06-11 NOTE — H&P (Signed)
 HISTORY AND PHYSICAL INTERVAL NOTE:  06/11/2024  11:54 AM  Jack Morrison  has presented today for surgery, with the diagnosis of Neoplasm of uncertain behavior  Abscess of foot including toes, right.  The various methods of treatment have been discussed with the patient.  No guarantees were given.  After consideration of risks, benefits and other options for treatment, the patient has consented to surgery.  I have reviewed the patients' chart and labs.     A history and physical examination was performed in my office.  The patient was reexamined.  There have been no changes to this history and physical examination.  Jack Morrison A

## 2024-06-11 NOTE — Transfer of Care (Signed)
 Immediate Anesthesia Transfer of Care Note  Patient: Jack Morrison  Procedure(s) Performed: EXCISION, EXOSTOSIS (Right: Foot)  Patient Location: PACU  Anesthesia Type: General  Level of Consciousness: awake, alert  and patient cooperative  Airway and Oxygen Therapy: Patient Spontanous Breathing and Patient connected to supplemental oxygen  Post-op Assessment: Post-op Vital signs reviewed, Patient's Cardiovascular Status Stable, Respiratory Function Stable, Patent Airway and No signs of Nausea or vomiting  Post-op Vital Signs: Reviewed and stable  Complications: No notable events documented.

## 2024-06-11 NOTE — Anesthesia Postprocedure Evaluation (Signed)
 Anesthesia Post Note  Patient: Jack Morrison  Procedure(s) Performed: EXCISION, EXOSTOSIS (Right: Foot)  Patient location during evaluation: PACU Anesthesia Type: General Level of consciousness: awake and alert Pain management: pain level controlled Vital Signs Assessment: post-procedure vital signs reviewed and stable Respiratory status: spontaneous breathing, nonlabored ventilation, respiratory function stable and patient connected to nasal cannula oxygen Cardiovascular status: blood pressure returned to baseline and stable Postop Assessment: no apparent nausea or vomiting Anesthetic complications: no   No notable events documented.   Last Vitals:  Vitals:   06/11/24 1248 06/11/24 1300  BP:  134/88  Pulse: 86 71  Resp: 13 14  Temp:  36.7 C  SpO2: 98% 98%    Last Pain:  Vitals:   06/11/24 1255  TempSrc:   PainSc: 0-No pain                 [name]

## 2024-06-12 ENCOUNTER — Encounter: Payer: Self-pay | Admitting: Podiatry

## 2024-06-13 LAB — SURGICAL PATHOLOGY

## 2024-06-16 LAB — AEROBIC/ANAEROBIC CULTURE W GRAM STAIN (SURGICAL/DEEP WOUND): Gram Stain: NONE SEEN
